# Patient Record
Sex: Female | Born: 1980 | Hispanic: Yes | Marital: Single | Smoking: Never smoker
Health system: Southern US, Community
[De-identification: ages and names within clinical notes are randomized; demographics above are authoritative.]

## PROBLEM LIST (undated history)

## (undated) ENCOUNTER — Inpatient Hospital Stay (HOSPITAL_COMMUNITY): Payer: Self-pay

## (undated) DIAGNOSIS — Z789 Other specified health status: Secondary | ICD-10-CM

## (undated) HISTORY — PX: APPENDECTOMY: SHX54

---

## 2006-11-15 ENCOUNTER — Ambulatory Visit: Payer: Self-pay | Admitting: Family Medicine

## 2006-11-22 ENCOUNTER — Emergency Department (HOSPITAL_COMMUNITY): Admission: EM | Admit: 2006-11-22 | Discharge: 2006-11-22 | Payer: Self-pay | Admitting: Emergency Medicine

## 2007-03-23 ENCOUNTER — Ambulatory Visit (HOSPITAL_COMMUNITY): Admission: RE | Admit: 2007-03-23 | Discharge: 2007-03-23 | Payer: Self-pay | Admitting: Family Medicine

## 2007-04-16 ENCOUNTER — Inpatient Hospital Stay (HOSPITAL_COMMUNITY): Admission: AD | Admit: 2007-04-16 | Discharge: 2007-04-16 | Payer: Self-pay | Admitting: Obstetrics and Gynecology

## 2007-06-21 ENCOUNTER — Inpatient Hospital Stay (HOSPITAL_COMMUNITY): Admission: AD | Admit: 2007-06-21 | Discharge: 2007-06-23 | Payer: Self-pay | Admitting: Obstetrics & Gynecology

## 2007-06-21 ENCOUNTER — Ambulatory Visit: Payer: Self-pay | Admitting: Gynecology

## 2007-08-31 ENCOUNTER — Ambulatory Visit: Payer: Self-pay | Admitting: Family Medicine

## 2007-11-23 ENCOUNTER — Ambulatory Visit: Payer: Self-pay | Admitting: Gynecology

## 2007-12-26 ENCOUNTER — Ambulatory Visit: Admission: RE | Admit: 2007-12-26 | Discharge: 2007-12-26 | Payer: Self-pay | Admitting: Gynecology

## 2011-08-06 LAB — COMPREHENSIVE METABOLIC PANEL
Albumin: 3.2 — ABNORMAL LOW
Alkaline Phosphatase: 278 — ABNORMAL HIGH
BUN: 6
Chloride: 105
GFR calc Af Amer: >60
Glucose, Bld: 106 — ABNORMAL HIGH
Sodium: 134 — ABNORMAL LOW

## 2011-08-06 LAB — URIC ACID: Uric Acid, Serum: 4.9

## 2011-08-06 LAB — CBC
HCT: 25.3 — ABNORMAL LOW
HCT: 39.1
Hemoglobin: 13.6
Hemoglobin: 8.8 — ABNORMAL LOW
MCV: 90.8
MCV: 91.9
RBC: 2.75 — ABNORMAL LOW
RDW: 13

## 2011-08-06 LAB — AMYLASE: Amylase: 105

## 2011-08-06 LAB — LACTATE DEHYDROGENASE: LDH: 186

## 2011-08-11 LAB — URINALYSIS, ROUTINE W REFLEX MICROSCOPIC
Bilirubin Urine: NEGATIVE
Hgb urine dipstick: NEGATIVE
Ketones, ur: NEGATIVE
Nitrite: NEGATIVE
Specific Gravity, Urine: 1.015
Urobilinogen, UA: 0.2
pH: 7.5

## 2016-12-25 ENCOUNTER — Encounter (HOSPITAL_COMMUNITY): Payer: Self-pay

## 2016-12-25 ENCOUNTER — Inpatient Hospital Stay (HOSPITAL_COMMUNITY): Payer: Self-pay

## 2016-12-25 ENCOUNTER — Inpatient Hospital Stay (HOSPITAL_COMMUNITY)
Admission: AD | Admit: 2016-12-25 | Discharge: 2016-12-25 | Disposition: A | Discharge status: Home / Self Care | Payer: Self-pay | Source: Ambulatory Visit | Attending: Obstetrics & Gynecology | Admitting: Obstetrics & Gynecology

## 2016-12-25 DIAGNOSIS — O209 Hemorrhage in early pregnancy, unspecified: Secondary | ICD-10-CM

## 2016-12-25 DIAGNOSIS — Z3A09 9 weeks gestation of pregnancy: Secondary | ICD-10-CM | POA: Insufficient documentation

## 2016-12-25 DIAGNOSIS — O26891 Other specified pregnancy related conditions, first trimester: Secondary | ICD-10-CM | POA: Insufficient documentation

## 2016-12-25 DIAGNOSIS — R109 Unspecified abdominal pain: Secondary | ICD-10-CM | POA: Insufficient documentation

## 2016-12-25 DIAGNOSIS — O208 Other hemorrhage in early pregnancy: Secondary | ICD-10-CM | POA: Insufficient documentation

## 2016-12-25 HISTORY — DX: Other specified health status: Z78.9

## 2016-12-25 LAB — URINALYSIS, ROUTINE W REFLEX MICROSCOPIC
Bilirubin Urine: NEGATIVE
GLUCOSE, UA: NEGATIVE mg/dL
Ketones, ur: NEGATIVE mg/dL
Nitrite: NEGATIVE
PROTEIN: 30 mg/dL — AB
SPECIFIC GRAVITY, URINE: 1.012 (ref 1.005–1.030)
pH: 9 — ABNORMAL HIGH (ref 5.0–8.0)

## 2016-12-25 LAB — POCT PREGNANCY, URINE: PREG TEST UR: POSITIVE — AB

## 2016-12-25 LAB — HCG, QUANTITATIVE, PREGNANCY: hCG, Beta Chain, Quant, S: 8527 m[IU]/mL — ABNORMAL HIGH (ref <5)

## 2016-12-25 MED ORDER — CEPHALEXIN 500 MG PO CAPS: 500.0000 mg | ORAL_CAPSULE | Freq: Four times a day (QID) | ORAL | 0 refills | Status: DC

## 2016-12-25 NOTE — MAU Provider Note (Signed)
History   G2P0010 @ 9.3 wks by LMP in with abd pain and vag bleeding since 0230 this morning. Denies any other complaint  CSN: [id number]  Arrival date & time 12/25/16  1234   None     Chief Complaint  Patient presents with  . Abdominal Pain    HPI  Past Medical History:  Diagnosis Date  . Medical history non-contributory     Past Surgical History:  Procedure Laterality Date  . APPENDECTOMY      No family history on file.  Social History  Substance Use Topics  . Smoking status: Never Smoker  . Smokeless tobacco: Never Used  . Alcohol use No    OB History    Gravida Para Term Preterm AB Living   2         1   SAB TAB Ectopic Multiple Live Births                  Review of Systems  Constitutional: Negative.   HENT: Negative.   Eyes: Negative.   Respiratory: Negative.   Cardiovascular: Negative.   Gastrointestinal: Positive for abdominal pain.  Endocrine: Negative.   Genitourinary: Positive for vaginal bleeding.  Musculoskeletal: Negative.   Skin: Negative.     Allergies  Patient has no allergy information on record.  Home Medications    BP (!) 109/48 (BP Location: Left Arm)   Pulse 83   Resp 18   Physical Exam  Constitutional: She is oriented to person, place, and time. She appears well-developed and well-nourished.  HENT:  Head: Normocephalic.  Eyes: Pupils are equal, round, and reactive to light.  Neck: Normal range of motion.  Cardiovascular: Normal rate, regular rhythm, normal heart sounds and intact distal pulses.   Pulmonary/Chest: Effort normal and breath sounds normal.  Abdominal: Soft. Bowel sounds are normal.  Genitourinary:  Genitourinary Comments: sm amt dark vag bleeding  Musculoskeletal: Normal range of motion.  Neurological: She is alert and oriented to person, place, and time. She has normal reflexes.  Skin: Skin is warm and dry.  Psychiatric: She has a normal mood and affect. Her behavior is normal. Judgment and thought  content normal.    MAU Course  Procedures (including critical care time)  Labs Reviewed  URINALYSIS, ROUTINE W REFLEX MICROSCOPIC - Abnormal; Notable for the following:       Result Value   APPearance HAZY (*)    pH 9.0 (*)    Hgb urine dipstick LARGE (*)    Protein, ur 30 (*)    Leukocytes, UA TRACE (*)    Bacteria, UA RARE (*)    Squamous Epithelial / LPF 0-5 (*)    All other components within normal limits  POCT PREGNANCY, URINE - Abnormal; Notable for the following:    Preg Test, Ur POSITIVE (*)    All other components within normal limits  HCG, QUANTITATIVE, PREGNANCY   No results found.   1. Abdominal pain during pregnancy in first trimester       MDM  VSS, sm amt dark vag bleeding. [place] shows early IUP. Quant [id number]. Will sent to clinic Monday for repeat quant.

## 2016-12-25 NOTE — MAU Note (Signed)
Pt presents to MAU with abdominal pain and bleeding. Pt states symptoms started around 2:30am. Pt reports feeling cramping in her lower abdomen, pain in LUQ and lower back pain. Pt States she has had a +HPT

## 2016-12-25 NOTE — Discharge Instructions (Signed)
Dolor abdominal durante el embarazo  (Abdominal Pain During Pregnancy)  El dolor de vientre (abdominal) es habitual durante el embarazo. Generalmente no se trata de un problema grave. Otras veces puede ser un signo de que algo no anda bien. Siempre comuníquese con su médico si tiene dolor abdominal.  CUIDADOS EN EL HOGAR  Controle el dolor para ver si hay cambios. Las indicaciones que siguen pueden ayudarla a sentirse mejor:  · Notenga sexo (relaciones sexuales) ni se coloque nada dentro de la vagina hasta que se sienta mejor.  · Haga reposo hasta que el dolor se calme.  · Si siente ganas de vomitar (náuseas ) beba líquidos claros. No consuma alimentos sólidos hasta que se sienta mejor.  · Sólo tome los medicamentos que le haya indicado su médico.  · Cumpla con las visitas al médico según las indicaciones.  SOLICITE AYUDA DE INMEDIATO SI:  · Tiene un sangrado, pierde líquido o elimina trozos de tejido por la vagina.  · Siente más dolor o cólicos.  · Comienza a vomitar.  · Siente dolor al orinar u observa sangre en la orina.  · Tiene fiebre.  · No siente que el bebé se mueva mucho.  · Se siente muy débil o cree que va a desmayarse.  · Tiene dificultad para respirar con o sin dolor en el vientre.  · Siente un dolor de cabeza muy intenso y dolor en el vientre.  · Observa que sale un líquido por la vagina y tiene dolor abdominal.  · La materia fecal es líquida (diarrea).  · El dolor en el viente no desaparece, o empeora, luego de hacer reposo.    ASEGÚRESE DE QUE:  · Comprende estas instrucciones.  · Controlará su afección.  · Recibirá ayuda de inmediato si no mejora o si empeora.    Esta información no tiene como fin reemplazar el consejo del médico. Asegúrese de hacerle al médico cualquier pregunta que tenga.  Document Released: 06/23/2011 Document Revised: 02/02/2016 Document Reviewed: 05/10/2013  Elsevier Interactive Patient Education © 2017 Elsevier Inc.

## 2016-12-27 ENCOUNTER — Encounter (HOSPITAL_COMMUNITY): Payer: Self-pay | Admitting: *Deleted

## 2016-12-27 ENCOUNTER — Inpatient Hospital Stay (HOSPITAL_COMMUNITY)
Admission: AD | Admit: 2016-12-27 | Discharge: 2016-12-27 | Disposition: A | Discharge status: Home / Self Care | Payer: Self-pay | Source: Ambulatory Visit | Attending: Family Medicine | Admitting: Family Medicine

## 2016-12-27 ENCOUNTER — Ambulatory Visit: Payer: Self-pay

## 2016-12-27 ENCOUNTER — Inpatient Hospital Stay (HOSPITAL_COMMUNITY): Payer: Self-pay

## 2016-12-27 DIAGNOSIS — N939 Abnormal uterine and vaginal bleeding, unspecified: Secondary | ICD-10-CM

## 2016-12-27 DIAGNOSIS — R109 Unspecified abdominal pain: Secondary | ICD-10-CM | POA: Insufficient documentation

## 2016-12-27 DIAGNOSIS — O039 Complete or unspecified spontaneous abortion without complication: Secondary | ICD-10-CM | POA: Insufficient documentation

## 2016-12-27 DIAGNOSIS — Z3A01 Less than 8 weeks gestation of pregnancy: Secondary | ICD-10-CM | POA: Insufficient documentation

## 2016-12-27 LAB — ABO/RH: ABO/RH(D): B POS

## 2016-12-27 MED ORDER — OXYCODONE-ACETAMINOPHEN 5-325 MG PO TABS
1.0000 | ORAL_TABLET | Freq: Once | ORAL | Status: AC
  Administered 2016-12-27: 1 via ORAL
  Filled 2016-12-27: qty 1

## 2016-12-27 MED ORDER — KETOROLAC TROMETHAMINE 60 MG/2ML IM SOLN
60.0000 mg | Freq: Once | INTRAMUSCULAR | Status: AC
  Administered 2016-12-27: 60 mg via INTRAMUSCULAR
  Filled 2016-12-27: qty 2

## 2016-12-27 MED ORDER — HYDROCODONE-ACETAMINOPHEN 5-325 MG PO TABS: 1.0000 | ORAL_TABLET | ORAL | 0 refills | Status: DC | PRN

## 2016-12-27 MED ORDER — IBUPROFEN 600 MG PO TABS: 600.0000 mg | ORAL_TABLET | Freq: Four times a day (QID) | ORAL | 0 refills | Status: DC | PRN

## 2016-12-27 NOTE — Discharge Instructions (Signed)
Aborto espontáneo °(Miscarriage) °El aborto espontáneo es la pérdida de un bebé que no ha nacido.(feto) antes de la semana 20 del embarazo. La causa generalmente es desconocida. °CUIDADOS EN EL HOGAR °· Debe permanecer en cama (reposo en cama) o podrá hacer actividades livianas. Regrese a sus actividades según las indicaciones del médico. °· Pida ayuda con las tareas domésticas. °· Anote cuántos apósitos usa por día. Describa el grado en que están empapados. °· No use tampones. No se higienice la vagina (duchas vaginales) ni tenga relaciones sexuales (coito) hasta que el médico la autorice. °· Sólo debe tomar la medicación según las indicaciones del médico. °· No tome aspirina. °· Cumpla con los controles médicos según las indicaciones. °· Si usted o su pareja tienen problemas con el duelo, hable con su médico. También puede intentar con psicoterapia. Permítase el tiempo suficiente de duelo antes de quedar embarazada nuevamente. ° °SOLICITE AYUDA DE INMEDIATO SI: °· Siente cólicos intensos o dolor en el estómago, en la espalda o en el vientre (abdomen). °· Tiene fiebre. °· Elimina grumos de sangre (coágulos) por la vagina, que tienen el tamaño de una nuez o más. Guarde los coágulos para que el médico los vea. °· Elimina gran cantidad de tejidos por la vagina. Guarde lo que ha eliminado para que su médico lo examine. °· Aumenta el sangrado. °· Observa una secreción espesa, con mal olor (pérdida) que proviene de la vagina. °· Se siente mareada, débil o se desvanece (se desmaya). °· Siente escalofríos. ° °ASEGÚRESE DE QUE: °· Comprende estas instrucciones. °· Controlará su enfermedad. °· Solicitará ayuda de inmediato si no mejora o si empeora. ° °Esta información no tiene como fin reemplazar el consejo del médico. Asegúrese de hacerle al médico cualquier pregunta que tenga. °Document Released: 04/11/2012 Document Revised: 04/11/2012 Document Reviewed: 11/11/2011 °Elsevier Interactive Patient Education © 2017 Elsevier  Inc. ° °

## 2016-12-27 NOTE — MAU Provider Note (Signed)
History     CSN: [id number]  Arrival date and time: 12/27/16 4098   First Provider Initiated Contact with Patient 12/27/16 [phone]      Chief Complaint  Patient presents with  . Abdominal Pain  . Vaginal Bleeding   HPI   Hayley Wolfe spanish interpretor at bedside.   [name] is a 36 y.o. female G3P0 @ [redacted]w[redacted]d here in MAU with worsening pain and bleeding. She was seen here on Saturday 3/3 for these symptoms. She was told that the pregnancy was early and that she needed to come here to the [facility] for blood work. Instead of going to the clinic she came here because her symptoms have worsened. She currently rates her pain 9/10, Her bleeding is heavy like a menstrual cycle.   OB History    Gravida Para Term Preterm AB Living   3         1   SAB TAB Ectopic Multiple Live Births                  Past Medical History:  Diagnosis Date  . Medical history non-contributory     Past Surgical History:  Procedure Laterality Date  . APPENDECTOMY      History reviewed. No pertinent family history.  Social History  Substance Use Topics  . Smoking status: Never Smoker  . Smokeless tobacco: Never Used  . Alcohol use No    Allergies: No Known Allergies  Prescriptions Prior to Admission  Medication Sig Dispense Refill Last Dose  . cephALEXin (KEFLEX) 500 MG capsule Take 1 capsule (500 mg total) by mouth 4 (four) times daily. 20 capsule 0   . Prenatal Vit-Fe Fumarate-FA (PRENATAL MULTIVITAMIN) TABS tablet Take 1 tablet by mouth daily at 12 noon.   12/24/2016 at Unknown time    Results for orders placed or performed during the hospital encounter of 12/27/16 (from the past 48 hour(s))  ABO/Rh     Status: None   Collection Time: 12/27/16  8:31 AM  Result Value Ref Range   ABO/RH(D) B POS    US Ob Transvaginal  Result Date: 12/27/2016 CLINICAL DATA:  36 year old pregnant female presenting with worsening pelvic pain and bleeding. Early intrauterine gestation without embryonic cardiac  activity on obstetric scan from 2 days prior. EDC by initial sonogram: 08/22/2017, projecting to an expected gestational age of [redacted] weeks 0 days. EXAM: TRANSVAGINAL OB ULTRASOUND TECHNIQUE: Transvaginal ultrasound was performed for complete evaluation of the gestation as well as the maternal uterus, adnexal regions, and pelvic cul-de-sac. COMPARISON:  12/25/2016 obstetric scan. FINDINGS: Anteverted anteflexed uterus. No uterine fibroids or other myometrial abnormalities. Bilayer endometrial thickness 9 mm. Heterogeneous endometrium. Previously visualized intrauterine gestational sac is absent on this scan. No discrete endometrial mass. Right ovary measures 2.5 x 1.6 x 2.0 cm. Left ovary measures 2.7 x 2.2 x 2.6 cm. No suspicious ovarian or adnexal masses. No abnormal free fluid in the pelvis. IMPRESSION: Heterogeneous endometrium measuring 9 mm in bilayer thickness. Previously visualized intrauterine gestational sac is absent on this scan. Findings are compatible with spontaneous abortion in progress. No ovarian or adnexal abnormality. No abnormal free fluid in the pelvis. Electronically Signed   By: Delbert Phenix M.D.   On: 12/27/2016 09:37   Review of Systems  Constitutional: Negative for chills and fever.  Gastrointestinal: Positive for abdominal pain.  Genitourinary: Positive for vaginal bleeding.   Physical Exam   Blood pressure 111/65, pulse 88, temperature 98.4 F (36.9 C), temperature source Oral, resp. rate  18, height 5' 1.5" (1.562 m), weight 141 lb (64 kg), SpO2 100 %.  Physical Exam  Constitutional: She is oriented to person, place, and time. She appears well-developed and well-nourished. No distress.  HENT:  Head: Normocephalic.  GI: Soft. There is tenderness in the right lower quadrant, suprapubic area and left lower quadrant. There is rigidity. There is no rebound and no guarding.  Genitourinary:  Genitourinary Comments: Vagina - Small amount of dark red blood Cervix -  + active  bleeding  Bimanual exam: Cervix slightly open  Uterus non tender, normal size Chaperone present for exam.   Musculoskeletal: Normal range of motion.  Neurological: She is alert and oriented to person, place, and time.  Skin: Skin is warm. She is not diaphoretic.  Psychiatric: Her behavior is normal.    MAU Course  Procedures  None  MDM  B positive blood type  US on 3/3 shows gestational sac with Yolk sac and embryo: 3730w5d. Quant 1,6108,527. Due to the amount of bleeding and patients significant pain will repeat US today Toradol 60 mg IM given, percocet 1 tab given PO US today shows absent gestational sac. Pain improved.   Assessment and Plan   A:  1. SAB (spontaneous abortion)   2. Vaginal bleeding     P:  Discharge home in stable condition Rx: Ibuprofen, percocet [facility]-2 weeks Bleeding precautions Return to MAU if symptoms worsen Support given  Duane LopeJennifer I Rasch, NP 12/27/2016 2:33 PM

## 2016-12-27 NOTE — MAU Note (Signed)
Was here on Saturday. Has an appt at clinic this morning for repeat bloodwork.  Having increase in pain and bleeding this morning.

## 2017-01-10 ENCOUNTER — Ambulatory Visit (INDEPENDENT_AMBULATORY_CARE_PROVIDER_SITE_OTHER): Payer: Self-pay | Admitting: Family Medicine

## 2017-01-10 ENCOUNTER — Ambulatory Visit (INDEPENDENT_AMBULATORY_CARE_PROVIDER_SITE_OTHER): Payer: Self-pay | Admitting: Clinical

## 2017-01-10 ENCOUNTER — Encounter: Payer: Self-pay | Admitting: Family Medicine

## 2017-01-10 VITALS — BP 116/76 | HR 72 | Ht 63.0 in | Wt 141.2 lb

## 2017-01-10 DIAGNOSIS — F4323 Adjustment disorder with mixed anxiety and depressed mood: Secondary | ICD-10-CM

## 2017-01-10 DIAGNOSIS — Z113 Encounter for screening for infections with a predominantly sexual mode of transmission: Secondary | ICD-10-CM

## 2017-01-10 DIAGNOSIS — R4589 Other symptoms and signs involving emotional state: Secondary | ICD-10-CM

## 2017-01-10 DIAGNOSIS — O039 Complete or unspecified spontaneous abortion without complication: Secondary | ICD-10-CM

## 2017-01-10 DIAGNOSIS — N898 Other specified noninflammatory disorders of vagina: Secondary | ICD-10-CM

## 2017-01-10 NOTE — Progress Notes (Signed)
Pt thinks she may have a vaginal infection due to a strong odor.  Pt to see Asher MuirJamie for high score of phq9.

## 2017-01-10 NOTE — Patient Instructions (Addendum)
Aborto espontáneo °(Miscarriage) °El aborto espontáneo es la pérdida de un bebé que no ha nacido.(feto) antes de la semana 20 del embarazo. La causa generalmente es desconocida. °CUIDADOS EN EL HOGAR °· Debe permanecer en cama (reposo en cama) o podrá hacer actividades livianas. Regrese a sus actividades según las indicaciones del médico. °· Pida ayuda con las tareas domésticas. °· Anote cuántos apósitos usa por día. Describa el grado en que están empapados. °· No use tampones. No se higienice la vagina (duchas vaginales) ni tenga relaciones sexuales (coito) hasta que el médico la autorice. °· Sólo debe tomar la medicación según las indicaciones del médico. °· No tome aspirina. °· Cumpla con los controles médicos según las indicaciones. °· Si usted o su pareja tienen problemas con el duelo, hable con su médico. También puede intentar con psicoterapia. Permítase el tiempo suficiente de duelo antes de quedar embarazada nuevamente. ° °SOLICITE AYUDA DE INMEDIATO SI: °· Siente cólicos intensos o dolor en el estómago, en la espalda o en el vientre (abdomen). °· Tiene fiebre. °· Elimina grumos de sangre (coágulos) por la vagina, que tienen el tamaño de una nuez o más. Guarde los coágulos para que el médico los vea. °· Elimina gran cantidad de tejidos por la vagina. Guarde lo que ha eliminado para que su médico lo examine. °· Aumenta el sangrado. °· Observa una secreción espesa, con mal olor (pérdida) que proviene de la vagina. °· Se siente mareada, débil o se desvanece (se desmaya). °· Siente escalofríos. ° °ASEGÚRESE DE QUE: °· Comprende estas instrucciones. °· Controlará su enfermedad. °· Solicitará ayuda de inmediato si no mejora o si empeora. ° °Esta información no tiene como fin reemplazar el consejo del médico. Asegúrese de hacerle al médico cualquier pregunta que tenga. °Document Released: 04/11/2012 Document Revised: 04/11/2012 Document Reviewed: 11/11/2011 °Elsevier Interactive Patient Education © 2017 Elsevier  Inc. ° °

## 2017-01-10 NOTE — Progress Notes (Signed)
    Subjective:    Patient ID: Hayley Wolfe is a 36 y.o. female presenting with Miscarriage  on 01/10/2017 Spanish interpreter: Mariel used  HPI: s/p SAB 3/5. No further bleeding. Reports several days of vaginal odor.  Report some LUQ pain. Began when she got pregnant. stabbing pain. Comes and goes, and not related to food, BM, movement. Improved with sleeping on the affected side.  Reports poor mood since her miscarriage. She is very irritated and grumpy most of the time.   Review of Systems  Constitutional: Negative for chills and fever.  Respiratory: Negative for shortness of breath.   Cardiovascular: Negative for chest pain.  Gastrointestinal: Negative for abdominal pain, nausea and vomiting.  Genitourinary: Negative for dysuria.  Skin: Negative for rash.      Objective:    BP 116/76   Pulse 72   Ht 5\' 3"  (1.6 m)   Wt 141 lb 3.2 oz (64 kg)   Breastfeeding? No   BMI 25.01 kg/m  Physical Exam  Constitutional: She is oriented to person, place, and time. She appears well-developed and well-nourished. No distress.  HENT:  Head: Normocephalic and atraumatic.  Eyes: No scleral icterus.  Neck: Neck supple.  Cardiovascular: Normal rate.   Pulmonary/Chest: Effort normal.  Abdominal: Soft.  Genitourinary:  Genitourinary Comments: BUS normal, vagina is pink and rugated, cervix is nulliparous without lesion, uterus is small and anteverted, no adnexal mass or tenderness.   Neurological: She is alert and oriented to person, place, and time.  Skin: Skin is warm and dry.  Psychiatric: She has a normal mood and affect.        Assessment & Plan:  Complete miscarriage - complete-->ok to try again asap  Vaginal odor - check wet prep - Plan: Cervicovaginal ancillary only  Dysphoric mood - to see Asher MuirJamie - Plan: Ambulatory referral to Integrated Behavioral Health   Total face-to-face time with patient: 15 minutes. Over 50% of encounter was spent on counseling and  coordination of care. Return if symptoms worsen or fail to improve.  Reva Boresanya S Luisalberto Beegle 01/10/2017 10:45 AM

## 2017-01-10 NOTE — BH Specialist Note (Addendum)
Integrated Behavioral Health Initial Visit  MRN: [id number] Name: Providence CrosbyManuela Jarquin-Rios   Session Start time: 12:15 Session End time: 12:35 Total time: 20 minutes  Type of Service: Integrated Behavioral Health- Individual/Family Interpretor:Yes.   Interpretor Name and Language: Addison NaegeliMuriel, Spanish   Warm Hand Off Completed.       SUBJECTIVE: Providence CrosbyManuela Jarquin-Rios is a 36 y.o. female accompanied by patient. Patient was referred by Dr Shawnie PonsPratt for depression. Patient reports the following symptoms/concerns: Pt states that her primary concern is mild sleep difficulty since her miscarriage, and husband being sick; copes by spending time with husband and 9yo son. Duration of problem: 2 weeks; Severity of problem: moderate  OBJECTIVE: Mood: Appropriate and Affect: Appropriate Risk of harm to self or others: No plan to harm self or others   LIFE CONTEXT: Family and Social: Lives with husband and 9yo School/Work: - Self-Care: - Life Changes: Husband recently sick, prior to miscarriage  GOALS ADDRESSED: Patient will reduce symptoms of: depression and anxiety increase knowledge and/or ability of: coping skills and also: Increase healthy adjustment to current life circumstances   INTERVENTIONS: Motivational Interviewing  Standardized Assessments completed: GAD-7 and PHQ 9  ASSESSMENT: Patient currently experiencing Adjustment disorder with mixed anxiety and depression. Patient may benefit from psychoeducation and brief therapeutic intervention regarding coping with symptoms of anxiety and depression.  PLAN: 1. Follow up with behavioral health clinician on : As needed 2. Behavioral recommendations:  -Read educational material regarding coping with symptoms of depression and anxiety -Consider sleep app, and leaving "worries" at bedside notebook, for improved quality of sleep -Consider [facility], as needed, if symptoms increase or do not  improve 3. Referral(s): [facility] and Counselor 4. "From scale of 1-10, how likely are you to follow plan?": 9  Valetta CloseJamie C McMannes, LCSWA  Depression screen [facility] 01/10/2017  Decreased Interest 2  Down, Depressed, Hopeless 2  PHQ - 2 Score 4  Altered sleeping 2  Tired, decreased energy 2  Change in appetite 2  Feeling bad or failure about yourself  2  Trouble concentrating 2  Moving slowly or fidgety/restless 2  Suicidal thoughts 0  PHQ-9 Score 16   GAD 7 : Generalized Anxiety Score 01/10/2017  Nervous, Anxious, on Edge 2  Control/stop worrying 2  Worry too much - different things 2  Trouble relaxing 2  Restless 1  Easily annoyed or irritable 2  Afraid - awful might happen 2  Total GAD 7 Score 13  Anxiety Difficulty Not difficult at all

## 2017-01-11 LAB — CERVICOVAGINAL ANCILLARY ONLY
Bacterial vaginitis: NEGATIVE
CANDIDA VAGINITIS: NEGATIVE
TRICH (WINDOWPATH): NEGATIVE

## 2017-10-25 NOTE — L&D Delivery Note (Addendum)
Operative Delivery Note At  a viable female was delivered via .  Presentation: vertex; Position: Left,, Occiput,, Anterior; Station: +3.  Verbal consent: obtained from patient.  Risks and benefits discussed in detail.  Risks include, but are not limited to the risks of anesthesia, bleeding, infection, damage to maternal tissues, fetal cephalhematoma.  There is also the risk of inability to effect vaginal delivery of the head, or shoulder dystocia that cannot be resolved by established maneuvers, leading to the need for emergency cesarean section. Fetal heal at crowning, kiwi vac placed and pt assisted x 1 pull with delivery of fetal head. Nuchal cord noted x 1 reduced. Unable to deliver anterior should after McRoberts, super pubic pressure, and woods screw maneuver. Posterior should delivered with delivery of rest of infant. Length of dystocia 1 minute. APGAR:  pending per NICU; weight  .   Placenta status:spont , shultz.   Cord:3vc  with the following complications:shoulder dysticia .  Cord pH: 7.30  Anesthesia epidural  :   Instruments: Kiwi Episiotomy:  none Lacerations:  4th Suture Repair: repair done by Dr. Emelda FearFerguson Est. Blood Loss 600(mL):    Mom to postpartum.  Baby to Couplet care / Skin to Skin.  Hayley Wolfe 06/04/2018, 6:27 PM  Repair of 4th degree laceration: Anal mucosa: 3-0 Vicryl Anal Sphincter . 4 sutures, figure of 8, superior, inferior, posterior, and anterior. 2-layer closure of perineal body , continuous running. 2-0 vicryl. Digital rectal confirms no defects or palpable sutures. Ancef 2 gm IV as surgical prophylaxis. Hayley BurrowJohn V Jcion Buddenhagen, MD

## 2017-12-26 ENCOUNTER — Inpatient Hospital Stay (HOSPITAL_COMMUNITY): Payer: Self-pay

## 2017-12-26 ENCOUNTER — Other Ambulatory Visit: Payer: Self-pay

## 2017-12-26 ENCOUNTER — Inpatient Hospital Stay (HOSPITAL_COMMUNITY)
Admission: AD | Admit: 2017-12-26 | Discharge: 2017-12-26 | Disposition: A | Discharge status: Home / Self Care | Payer: Self-pay | Source: Ambulatory Visit | Attending: Obstetrics and Gynecology | Admitting: Obstetrics and Gynecology

## 2017-12-26 ENCOUNTER — Encounter (HOSPITAL_COMMUNITY): Payer: Self-pay | Admitting: *Deleted

## 2017-12-26 DIAGNOSIS — O0932 Supervision of pregnancy with insufficient antenatal care, second trimester: Secondary | ICD-10-CM | POA: Insufficient documentation

## 2017-12-26 DIAGNOSIS — Z3A16 16 weeks gestation of pregnancy: Secondary | ICD-10-CM | POA: Insufficient documentation

## 2017-12-26 DIAGNOSIS — R102 Pelvic and perineal pain: Secondary | ICD-10-CM | POA: Insufficient documentation

## 2017-12-26 DIAGNOSIS — Z9889 Other specified postprocedural states: Secondary | ICD-10-CM | POA: Insufficient documentation

## 2017-12-26 DIAGNOSIS — Z3687 Encounter for antenatal screening for uncertain dates: Secondary | ICD-10-CM | POA: Insufficient documentation

## 2017-12-26 DIAGNOSIS — Z79899 Other long term (current) drug therapy: Secondary | ICD-10-CM | POA: Insufficient documentation

## 2017-12-26 DIAGNOSIS — R109 Unspecified abdominal pain: Secondary | ICD-10-CM | POA: Insufficient documentation

## 2017-12-26 DIAGNOSIS — O26899 Other specified pregnancy related conditions, unspecified trimester: Secondary | ICD-10-CM

## 2017-12-26 DIAGNOSIS — O26892 Other specified pregnancy related conditions, second trimester: Secondary | ICD-10-CM | POA: Insufficient documentation

## 2017-12-26 DIAGNOSIS — N83292 Other ovarian cyst, left side: Secondary | ICD-10-CM | POA: Insufficient documentation

## 2017-12-26 DIAGNOSIS — O9989 Other specified diseases and conditions complicating pregnancy, childbirth and the puerperium: Secondary | ICD-10-CM | POA: Insufficient documentation

## 2017-12-26 DIAGNOSIS — O09522 Supervision of elderly multigravida, second trimester: Secondary | ICD-10-CM | POA: Insufficient documentation

## 2017-12-26 LAB — URINALYSIS, ROUTINE W REFLEX MICROSCOPIC
BACTERIA UA: NONE SEEN
BILIRUBIN URINE: NEGATIVE
Glucose, UA: NEGATIVE mg/dL
Ketones, ur: 5 mg/dL — AB
LEUKOCYTES UA: NEGATIVE
NITRITE: NEGATIVE
PROTEIN: NEGATIVE mg/dL
Specific Gravity, Urine: 1.02 (ref 1.005–1.030)
pH: 7 (ref 5.0–8.0)

## 2017-12-26 LAB — HCG, QUANTITATIVE, PREGNANCY: HCG, BETA CHAIN, QUANT, S: 26269 m[IU]/mL — AB (ref <5)

## 2017-12-26 LAB — ABO/RH: ABO/RH(D): B POS

## 2017-12-26 LAB — CBC
HEMATOCRIT: 35.9 % — AB (ref 36.0–46.0)
HEMOGLOBIN: 12.6 g/dL (ref 12.0–15.0)
MCH: 30.4 pg (ref 26.0–34.0)
MCHC: 35.1 g/dL (ref 30.0–36.0)
MCV: 86.5 fL (ref 78.0–100.0)
Platelets: 218 10*3/uL (ref 150–400)
RBC: 4.15 MIL/uL (ref 3.87–5.11)
RDW: 13.2 % (ref 11.5–15.5)
WBC: 12.3 10*3/uL — AB (ref 4.0–10.5)

## 2017-12-26 MED ORDER — LACTATED RINGERS IV SOLN
INTRAVENOUS | Status: DC
  Administered 2017-12-26: 17:00:00 via INTRAVENOUS

## 2017-12-26 MED ORDER — OXYCODONE-ACETAMINOPHEN 5-325 MG PO TABS: 1.0000 | ORAL_TABLET | Freq: Four times a day (QID) | ORAL | 0 refills | Status: DC | PRN

## 2017-12-26 MED ORDER — KETOROLAC TROMETHAMINE 30 MG/ML IJ SOLN
30.0000 mg | Freq: Once | INTRAMUSCULAR | Status: AC
  Administered 2017-12-26: 30 mg via INTRAVENOUS
  Filled 2017-12-26: qty 1

## 2017-12-26 MED ORDER — HYDROMORPHONE HCL 1 MG/ML IJ SOLN
1.0000 mg | Freq: Once | INTRAMUSCULAR | Status: AC
  Administered 2017-12-26: 1 mg via INTRAVENOUS
  Filled 2017-12-26: qty 1

## 2017-12-26 NOTE — MAU Provider Note (Signed)
History     CSN: [id number]  Arrival date and time: 12/26/17 1635   First Provider Initiated Contact with Patient 12/26/17 1641      Chief Complaint  Patient presents with  . Abdominal Pain   HPI Hayley Wolfe is 37 y.o. G3O7564 [redacted]w[redacted]d weeks presenting with acute onset of pain approx 30 mins prior to arrival.  She appears very uncomfortable. Pain is in the lower left quadrant. Began as intermittent pain and after 10 minutes was constant. With interpreter, history of obtained. LMP 09/09/2017-normal cycle for her.  By dates she is [redacted]w[redacted]d.  Describes having a little nausea without vomiting.  Neg for vaginal bleeding.  She has not begun prenatal care.       Past Medical History:  Diagnosis Date  . Medical history non-contributory     Past Surgical History:  Procedure Laterality Date  . APPENDECTOMY      No family history on file.  Social History   Tobacco Use  . Smoking status: Never Smoker  . Smokeless tobacco: Never Used  Substance Use Topics  . Alcohol use: No  . Drug use: No    Allergies: No Known Allergies  Medications Prior to Admission  Medication Sig Dispense Refill Last Dose  . Prenatal Vit-Fe Fumarate-FA (PRENATAL MULTIVITAMIN) TABS tablet Take 1 tablet by mouth daily at 12 noon.   Not Taking    Review of Systems  Constitutional: Negative for appetite change, chills, fatigue and fever.  Respiratory: Negative for shortness of breath.   Cardiovascular: Negative for chest pain.  Gastrointestinal: Positive for abdominal pain, constipation, diarrhea and vomiting.  Genitourinary: Positive for pelvic pain. Negative for vaginal bleeding and vaginal discharge.  Neurological: Negative for headaches.  Psychiatric/Behavioral: Negative for behavioral problems.   Physical Exam   Blood pressure (!) 108/59, pulse 73, temperature 97.7 F (36.5 C), temperature source Oral, resp. rate 18, last menstrual period 09/09/2017, SpO2 100 %.  Physical Exam  Nursing note  and vitals reviewed. Constitutional: She is oriented to person, place, and time. She appears well-developed and well-nourished. She appears distressed.  HENT:  Head: Normocephalic.  Neck: Normal range of motion.  Cardiovascular: Normal rate.  Respiratory: Effort normal.  GI: Soft. She exhibits no distension and no mass. There is tenderness (left lower abdominal pain. Pain in area of round ligament) in the right lower quadrant. There is no rebound, no guarding and no CVA tenderness.  Genitourinary: There is no rash, tenderness or lesion on the right labia. There is no rash, tenderness or lesion on the left labia. Uterus is tender. Enlarged: 15-16 week size.   Cervix exhibits no motion tenderness, no discharge and no friability. No erythema, tenderness or bleeding in the vagina. No vaginal discharge found.  Neurological: She is alert and oriented to person, place, and time.  Skin: Skin is warm and dry.  Psychiatric: She has a normal mood and affect. Her behavior is normal. Thought content normal.   FHR by doppler 150 bpm  Results for orders placed or performed during the hospital encounter of 12/26/17 (from the past 24 hour(s))  CBC     Status: Abnormal   Collection Time: 12/26/17  4:43 PM  Result Value Ref Range   WBC 12.3 (H) 4.0 - 10.5 K/uL   RBC 4.15 3.87 - 5.11 MIL/uL   Hemoglobin 12.6 12.0 - 15.0 g/dL   HCT 33.2 (L) 95.1 - 88.4 %   MCV 86.5 78.0 - 100.0 fL   MCH 30.4 26.0 - 34.0 pg  MCHC 35.1 30.0 - 36.0 g/dL   RDW 40.913.2 81.111.5 - 91.415.5 %   Platelets 218 150 - 400 K/uL  ABO/Rh     Status: None   Collection Time: 12/26/17  4:43 PM  Result Value Ref Range   ABO/RH(D)      B POS Performed at [facility], [address] [place], [place] [id number]   hCG, quantitative, pregnancy     Status: Abnormal   Collection Time: 12/26/17  4:43 PM  Result Value Ref Range   hCG, Beta Chain, Quant, S 26,269 (H) <5 mIU/mL  Urinalysis, Routine w reflex microscopic     Status: Abnormal    Collection Time: 12/26/17  5:33 PM  Result Value Ref Range   Color, Urine YELLOW YELLOW   APPearance HAZY (A) CLEAR   Specific Gravity, Urine 1.020 1.005 - 1.030   pH 7.0 5.0 - 8.0   Glucose, UA NEGATIVE NEGATIVE mg/dL   Hgb urine dipstick LARGE (A) NEGATIVE   Bilirubin Urine NEGATIVE NEGATIVE   Ketones, ur 5 (A) NEGATIVE mg/dL   Protein, ur NEGATIVE NEGATIVE mg/dL   Nitrite NEGATIVE NEGATIVE   Leukocytes, UA NEGATIVE NEGATIVE   RBC / HPF TOO NUMEROUS TO COUNT 0 - 5 RBC/hpf   WBC, UA 0-5 0 - 5 WBC/hpf   Bacteria, UA NONE SEEN NONE SEEN   Squamous Epithelial / LPF 0-5 (A) NONE SEEN   Mucus PRESENT    MAU Course  Procedures  GC/CHL pending.   MDM MSE Labs U/S --ruled out right ovarian torsion.  + for right ovarian cyst Morphine Sulfate 1mg  IV given in MAU Exam Consulted with Dr. Macon LargeAnyanwu.  History, Lab and U/S findings given.  Order given to for Toradol 30 mg IM before discharge home.  Home with Percocet #5 Toradol 30 mg given in MAU  Assessment and Plan  A:  Acute onset of right abdomino/pelvic pain     7764w4d gestation by U/S dating      Left simple ovarian cyst  4.4 X 4.2 X 4 cm      Ovarian torsion ruled out with u/S evaluation      Round ligament pain.  P:  Labs and U/S findings reviewed with the patient as well as Dr. Mont DuttonAnyanwu's plan of care-- Toradol 30mg  IM in MAU and Rx for Percocet for home.      She is waiting for [facility] [employer] [facility] appointment. Encouraged her to follow up with the person who is getting that for her.      Encouraged her to stay well hydrated and avoid twisting movements.     Return for worsening sxs.     Dennison Mascotve M Key 12/26/2017, 6:59 PM

## 2017-12-26 NOTE — MAU Note (Signed)
Pain started about 30 min ago. , LLQ.  Pt is preg.  States LMP was Nov 16

## 2017-12-26 NOTE — Discharge Instructions (Signed)
Dolor abdominal en el embarazo (Abdominal Pain During Pregnancy) El dolor abdominal es frecuente durante el embarazo. Generalmente no causa ningn dao. El dolor abdominal puede tener numerosas causas. Algunas causas son ms graves que otras. Ciertas causas de dolor abdominal durante el embarazo se diagnostican fcilmente. A veces, se tarda un tiempo para llegar al diagnstico. Otras veces la causa no se conoce. El dolor abdominal puede estar relacionado con Jerseyalguna alteracin del [place]embarazo, o puede deberse a una causa totalmente diferente. Por este motivo, siempre consulte a su mdico cuando sienta molestias abdominales. INSTRUCCIONES PARA EL CUIDADO EN EL HOGAR  Est atenta al dolor para ver si hay cambios. Las siguientes indicaciones ayudarn a Psychologist, educationalaliviar cualquier Longs Drug Storesmolestia que pueda sentir:  No Chiropodisttenga relaciones sexuales y no coloque nada dentro de la vagina hasta que los sntomas hayan desaparecido completamente.  Descanse todo lo que pueda RadioShackhasta que el dolor se le haya calmado.  Si siente nuseas, beba lquidos claros. Evite los alimentos slidos mientras sienta malestar o tenga nuseas.  Tome slo medicamentos de venta libre o recetados, segn las indicaciones del mdico.  Cumpla con todas las visitas de control, segn le indique su mdico. SOLICITE ATENCIN MDICA DE INMEDIATO SI:  Tiene un sangrado, prdida de lquidos o elimina tejidos por la vagina.  El dolor o los clicos [place]rick[place]aumentan.  Tiene vmitos persistentes.  Comienza a Financial risk analystsentir dolor al orinar u Centex Corporationobserva sangre.  Tiene fiebre.  Nota que los movimientos del beb disminuyen.  Siente intensa debilidad o se marea.  Tiene dificultad para respirar con o sin dolor abdominal.  Siente un dolor de cabeza intenso junto al dolor abdominal.  Shelle Ironiene una secrecin vaginal anormal con dolor abdominal.  Tiene diarrea persistente.  El dolor abdominal sigue o empeora an despus de Field seismologisthacer reposo. ASEGRESE DE QUE:   Comprende estas  instrucciones.  Controlar su afeccin.  Recibir ayuda de inmediato si no mejora o si empeora. Esta informacin no tiene Theme park managercomo fin reemplazar el consejo del mdico. Asegrese de hacerle al mdico cualquier pregunta que tenga. Document Released: 10/11/2005 Document Revised: 02/02/2016 Elsevier Interactive Patient Education  2017 [employer]vier [employer] Dolor del ligamento redondo (Round Ligament Pain) El ligamento redondo es un cordn de msculo y tejido que sirve de sostn para Careers information officerel tero. Puede volverse una fuente de dolor durante el embarazo si se distiende o se torsiona a medida que el beb crece. Generalmente, el dolor Triad Hospitalsempieza en el segundo trimestre de [place]embarazo, y Software engineerpuede aparecer y Landscape architectdesaparecer hasta el momento del [place]parto. No se trata de un problema grave y no es perjudicial para el beb. El dolor del ligamento redondo suele ser agudo y punzante, y durar poco tiempo, pero tambin puede ser sordo, persistente y continuo. Se lo percibe en la regin inferior del abdomen o en la ingle. A menudo comienza en la zona ms profunda de la ingle y se extiende hacia regin externa de la cadera. El Software engineerdolor puede aparecer en los siguientes casos:  Al cambiar repentinamente de posicin.  Al darse vuelta en la cama.  Al toser o estornudar.  Al realizar actividad fsica. INSTRUCCIONES PARA EL CUIDADO EN EL HOGAR Controle su afeccin para ver si hay cambios. Siga estos pasos para Acupuncturistaliviar el dolor:  Cuando el dolor comience, reljese. Luego intente lo siguiente: ? Sentarse. ? Flexionar las rodillas hacia el abdomen. ? Acostarse de costado con una almohada debajo del abdomen y Eastman Chemicalotra entre las piernas. ? Sentarse en una baera con agua tibia durante 15 a 20minutos o hasta que el  dolor desaparezca.  Tome los medicamentos de venta libre y los recetados solamente como se lo haya indicado el mdico.  Haga movimientos lentos al sentarse y pararse.  No haga caminatas largas si le generan dolor.  Suspenda o reduzca las  actividades fsicas si Public relations account executive. SOLICITE ATENCIN MDICA SI:  El dolor no desaparece con Scientist, research (medical).  Tiene un dolor en la espalda que no tena antes.  El medicamento no resulta eficaz. SOLICITE ATENCIN MDICA DE INMEDIATO SI:  Tiene escalofros o fiebre.  Tiene contracciones uterinas.  Presenta hemorragia vaginal.  Siente nuseas o vmitos.  Tiene diarrea.  Siente dolor al ConocoPhillips. Esta informacin no tiene Theme park manager el consejo del mdico. Asegrese de hacerle al mdico cualquier pregunta que tenga. Document Released: 09/23/2008 Document Revised: 01/03/2012 Document Reviewed: 12/18/2014 Elsevier Interactive Patient Education  [employer].

## 2017-12-26 NOTE — MAU Note (Signed)
Had a little pain, thought that she was hunger, had some fruit, pain was worse.  Then felt like she needed use restroom, for BM  But nothing came out.

## 2017-12-27 LAB — GC/CHLAMYDIA PROBE AMP (~~LOC~~) NOT AT ARMC
CHLAMYDIA, DNA PROBE: NEGATIVE
NEISSERIA GONORRHEA: NEGATIVE

## 2017-12-30 ENCOUNTER — Other Ambulatory Visit (INDEPENDENT_AMBULATORY_CARE_PROVIDER_SITE_OTHER): Payer: Self-pay

## 2017-12-30 DIAGNOSIS — Z3492 Encounter for supervision of normal pregnancy, unspecified, second trimester: Secondary | ICD-10-CM

## 2017-12-30 LAB — POCT UA - MICROSCOPIC ONLY

## 2017-12-30 LAB — POCT URINALYSIS DIP (MANUAL ENTRY)
Bilirubin, UA: NEGATIVE
GLUCOSE UA: NEGATIVE mg/dL
Ketones, POC UA: NEGATIVE mg/dL
Nitrite, UA: NEGATIVE
Protein Ur, POC: NEGATIVE mg/dL
RBC UA: NEGATIVE
SPEC GRAV UA: 1.015 (ref 1.010–1.025)
UROBILINOGEN UA: 0.2 U/dL
pH, UA: 7.5 (ref 5.0–8.0)

## 2017-12-30 NOTE — Progress Notes (Signed)
u

## 2018-01-01 LAB — OBSTETRIC PANEL, INCLUDING HIV
Antibody Screen: NEGATIVE
BASOS ABS: 0 10*3/uL (ref 0.0–0.2)
Basos: 0 %
EOS (ABSOLUTE): 0.1 10*3/uL (ref 0.0–0.4)
Eos: 1 %
HEP B S AG: NEGATIVE
HIV Screen 4th Generation wRfx: NONREACTIVE
Hematocrit: 37 % (ref 34.0–46.6)
Hemoglobin: 12.6 g/dL (ref 11.1–15.9)
IMMATURE GRANS (ABS): 0.1 10*3/uL (ref 0.0–0.1)
IMMATURE GRANULOCYTES: 1 %
LYMPHS ABS: 1.3 10*3/uL (ref 0.7–3.1)
LYMPHS: 15 %
MCH: 30.4 pg (ref 26.6–33.0)
MCHC: 34.1 g/dL (ref 31.5–35.7)
MCV: 89 fL (ref 79–97)
Monocytes Absolute: 0.3 10*3/uL (ref 0.1–0.9)
Monocytes: 4 %
NEUTROS PCT: 79 %
Neutrophils Absolute: 6.7 10*3/uL (ref 1.4–7.0)
PLATELETS: 216 10*3/uL (ref 150–379)
RBC: 4.15 x10E6/uL (ref 3.77–5.28)
RDW: 13.7 % (ref 12.3–15.4)
RPR: NONREACTIVE
Rh Factor: POSITIVE
Rubella Antibodies, IGG: 4.84 index (ref >0.99)
WBC: 8.4 10*3/uL (ref 3.4–10.8)

## 2018-01-01 LAB — SICKLE CELL SCREEN: Sickle Cell Screen: NEGATIVE

## 2018-01-03 LAB — URINE CULTURE, OB REFLEX

## 2018-01-03 LAB — CULTURE, OB URINE

## 2018-01-06 ENCOUNTER — Ambulatory Visit (INDEPENDENT_AMBULATORY_CARE_PROVIDER_SITE_OTHER): Payer: Self-pay | Admitting: Student in an Organized Health Care Education/Training Program

## 2018-01-06 ENCOUNTER — Encounter: Payer: Self-pay | Admitting: Student in an Organized Health Care Education/Training Program

## 2018-01-06 ENCOUNTER — Other Ambulatory Visit (HOSPITAL_COMMUNITY)
Admission: RE | Admit: 2018-01-06 | Discharge: 2018-01-06 | Disposition: A | Discharge status: Home / Self Care | Payer: Self-pay | Source: Ambulatory Visit | Attending: Family Medicine | Admitting: Family Medicine

## 2018-01-06 VITALS — BP 102/58 | HR 74 | Temp 98.8°F | Wt 140.4 lb

## 2018-01-06 DIAGNOSIS — Z3A18 18 weeks gestation of pregnancy: Secondary | ICD-10-CM

## 2018-01-06 DIAGNOSIS — Z124 Encounter for screening for malignant neoplasm of cervix: Secondary | ICD-10-CM | POA: Insufficient documentation

## 2018-01-06 DIAGNOSIS — Z3482 Encounter for supervision of other normal pregnancy, second trimester: Secondary | ICD-10-CM

## 2018-01-06 NOTE — Patient Instructions (Addendum)
Schedule follow up to be seen in 4 weeks.  Segundo trimestre de Public Service Enterprise Groupembarazo Second Trimester of Pregnancy El segundo trimestre va desde la semana14 hasta la 27, desde el cuarto hasta el sexto mes, y suele ser el momento en el que mejor se siente. Su organismo se ha adaptado a Charity fundraiserestar embarazada, y comienza a Diplomatic Services operational officersentirse fsicamente mejor. En general, las nuseas matutinas han disminuido o han desaparecido completamente, puede tener ms energa y un aumento de apetito. El segundo trimestre es tambin la poca en la que el feto se desarrolla rpidamente. Hacia el final del sexto mes, el feto mide aproximadamente 9pulgadas (23cm) y pesa alrededor de 1 libras (700g). Es probable que sienta que el beb se Teacher, English as a foreign languagemueve (da pataditas) entre las 16 y 20semanas del Psychiatristembarazo. Cambios en el cuerpo durante el segundo trimestre Su cuerpo continua experimentando numerosos cambios durante su segundo trimestre. Estos cambios varan de [place] mujer a Liechtensteinotra.  Seguir American Standard Companiesaumentando de peso. Notar que la parte baja del abdomen sobresale.  Podrn aparecer las primeras Albertson'sestras en las caderas, el abdomen y las Larkspurmamas.  Es posible que tenga dolores de cabeza que pueden aliviarse con ciertos medicamentos. Los medicamentos que tome deben estar aprobados por el mdico.  Tal vez tenga necesidad de orinar con ms frecuencia porque el feto est ejerciendo presin sobre la vejiga.  Debido al Vanetta Muldersembarazo podr sentir Anthoney Haradaacidez estomacal con frecuencia.  Puede estar estreida, ya que ciertas hormonas enlentecen los movimientos de los msculos que New York Life Insuranceempujan los desechos a travs de los intestinos.  Pueden aparecer hemorroides o abultarse e hincharse las venas (venas varicosas).  Puede sentir dolor en la espalda. Esto se debe a: ? Aumento de peso. ? Las hormonas del Management consultantembarazo relajan las articulaciones en la pelvis. ? Un cambio en el peso y los msculos que ayudan a Pharmacologistmantener su equilibrio.  Sus pechos seguirn creciendo y se pondrn cada vez ms  sensibles.  Las Veterinary surgeonencas pueden sangrar y estar sensibles al cepillado y al hilo dental.  Pueden aparecer zonas oscuras o manchas (cloasma, mscara del Fredoniaembarazo) en el rostro. Esto probablemente se atenuar despus del nacimiento del beb.  Es posible que se forme una lnea oscura desde el ombligo hasta la zona del pubis (linea nigra). Esto probablemente se atenuar despus del nacimiento del beb.  Tal vez haya cambios en el cabello. Esto cambios pueden incluir su engrosamiento, crecimiento rpido y Allied Waste Industriescambios en la textura. Adems, a algunas mujeres se les cae el cabello durante o despus del embarazo, o tienen el cabello seco o fino. Lo ms probable es que el cabello se le normalice despus del nacimiento del beb.  Qu debe esperar en las visitas prenatales Durante una visita prenatal de rutina:  La pesarn para asegurarse de que usted y el feto estn creciendo normalmente.  Le tomarn la presin arterial.  Le medirn el abdomen para controlar el desarrollo del beb.  Se escucharn los latidos cardacos fetales.  Se evaluarn los resultados de los estudios solicitados en visitas anteriores.  El mdico puede preguntarle lo siguiente:  Cmo se siente.  Si siente los movimientos del beb.  Si ha tenido sntomas anormales, como prdida de lquido, Parkssangrado, dolores de cabeza intensos o clicos abdominales.  Si est consumiendo algn producto que contenga tabaco, como cigarrillos, tabaco de Theatre managermascar y Administrator, Civil Servicecigarrillos electrnicos.  Si tiene Colgate-Palmolivealguna pregunta.  Otros estudios que podrn realizarse durante el segundo trimestre incluyen lo siguiente:  Anlisis de sangre para detectar lo siguiente: ? Concentraciones de hierro bajas (anemia). ? Nivel  alto de azcar en la sangre que afecta a las mujeres embarazadas (diabetes gestacional) entre las semanas 24 y 10. ? Anticuerpos Rh. Esto es para detectar una protena en los glbulos rojos (factor Rh).  Anlisis de orina para detectar  infecciones, diabetes o protenas en la orina.  Una ecografa para confirmar que el beb crece y se desarrolla correctamente.  Una amniocentesis para diagnosticar posibles problemas genticos.  Estudios del feto para descartar espina bfida y sndrome de [place].  Prueba del VIH (virus de inmunodeficiencia humana). Los exmenes prenatales de rutina incluyen la prueba de deteccin del VIH, a menos que decida no Futures trader.  Siga estas indicaciones en su casa: Medicamentos  Siga las indicaciones del mdico en relacin con el uso de medicamentos. Durante el embarazo, hay medicamentos que pueden tomarse y otros que no.  Tome vitaminas prenatales que contengan por lo menos (?g) de cido flico.  Si est estreida, tome un laxante suave, si el mdico lo autoriza. Qu debe comer y beber  Meriel Flavors una dieta equilibrada que incluya gran cantidad de frutas y verduras frescas, cereales integrales, buenas fuentes de protenas como carnes Moore, huevos o tofu, y lcteos descremados. El mdico la ayudar a Production assistant, radio cantidad de peso que puede [place].  No coma carne cruda ni quesos sin cocinar. Estos elementos contienen grmenes que pueden causar defectos congnitos en el beb.  Si no consume muchos alimentos con calcio, hable con su mdico sobre si debera tomar un suplemento diario de calcio.  Limite el consumo de alimentos con alto contenido de grasas y azcares procesados, como alimentos fritos o dulces.  Para evitar el estreimiento: ? Bebe suficiente lquido para mantener la orina clara o de color amarillo plido. ? Consuma alimentos ricos en fibra, como frutas y verduras frescas, cereales integrales y frijoles. Actividad  Haga ejercicio solamente como se lo haya indicado el mdico. La mayora de las mujeres pueden continuar su rutina de ejercicios durante el [place]. Intente realizar como mnimo de actividad fsica por lo menos 5das a la semana. Deje de hacer  ejercicio si experimenta contracciones uterinas.  No levante objetos pesados, use zapatos de tacones bajos y [address].  Puede seguir [employer], a menos que el mdico le indique lo contrario. Alivio del dolor y del Dentist  Use un sostn que le brinde buen soporte para prevenir las molestias causadas por la sensibilidad en los pechos.  Dese baos de asiento con agua tibia para Engineer, materials o las molestias causadas por las hemorroides. Use una crema para las hemorroides si el mdico la autoriza.  Descanse con las piernas elevadas si tiene calambres o dolor de cintura.  Si tiene venas varicosas, use medias de descanso. Eleve los pies durante , 3 o 4veces por da. Limite el consumo de sal en su dieta. Cuidados prenatales  Escriba sus preguntas. Llvelas cuando concurra a las visitas prenatales.  Concurra a todas las visitas prenatales tal como se lo haya indicado el mdico. Esto es importante. Seguridad  Use el cinturn de seguridad en todo momento mientras conduce.  Haga una lista de los nmeros de telfono de Associate Professor, que [employer] nmeros de telfono de familiares, [place], el hospital y los departamentos de polica y bomberos. Instrucciones generales  Pdale al mdico que la derive a clases de educacin prenatal en su localidad. Debe comenzar a tomar las clases antes de que empiece el mes6 de [place].  Pida ayuda si tiene necesidades nutricionales o de asesoramiento durante  el embarazo. El mdico puede aconsejarla o derivarla a especialistas para que la ayuden con diferentes necesidades.  No se d baos de inmersin en agua caliente, baos turcos ni saunas.  No se haga duchas vaginales ni use tampones o toallas higinicas perfumadas.  No mantenga las piernas cruzadas durante [place].  Evite el contacto con las bandejas sanitarias de los gatos y la tierra que estos animales usan. Estos elementos contienen bacterias  que pueden causar defectos congnitos al beb y la posible prdida del feto debido a un aborto espontneo o muerte fetal.  Evite fumar, consumir hierbas, beber alcohol y tomar frmacos que no le hayan recetado. Las sustancias qumicas que estos productos contienen pueden afectar la formacin y el desarrollo del beb.  No consuma ningn producto que contenga nicotina o tabaco, como cigarrillos y Administrator, Civil Service. Si necesita ayuda para dejar de fumar, consulte al [employer] Express.  Visite a su dentista si an no lo ha Occupational hygienist. Use un cepillo de dientes blando para higienizarse los dientes y psese el hilo dental con suavidad. Comunquese con un mdico si:  Tiene mareos.  Siente clicos leves, presin en la pelvis o dolor persistente en el abdomen.  Tiene nuseas, vmitos o diarrea persistentes.  Brett Fairy secrecin vaginal con mal olor.  Siente dolor al ConocoPhillips. Solicite ayuda de inmediato si:  Tiene fiebre.  Tiene una prdida de lquido por la vagina.  Tiene sangrado o pequeas prdidas vaginales.  Siente dolor intenso o clicos en el abdomen.  Sube de peso o baja de peso rpidamente.  Tiene dificultad para respirar y siente dolor de pecho.  Sbitamente se le hinchan mucho el rostro, las [place], los tobillos, los pies o las piernas.  No ha sentido los movimientos del beb durante Georgianne Fick.  Siente un dolor de cabeza intenso que no se alivia al tomar [employer].  Nota cambios en la visin. Resumen  El segundo trimestre va desde la semana14 hasta la 27, desde el cuarto hasta el sexto mes. Es tambin una poca en la que el feto se desarrolla rpidamente.  Su organismo atraviesa por muchos cambios durante el [place]. Estos cambios varan de [place] a [place].  Evite fumar, consumir hierbas, beber alcohol y tomar frmacos que no le hayan recetado. Estas sustancias qumicas afectan la formacin y el desarrollo de su beb.  No consuma ningn producto que  contenga tabaco, lo que incluye cigarrillos, tabaco de Theatre manager y Administrator, Civil Service. Si necesita ayuda para dejar de fumar, consulte al mdico.  Comunquese con su mdico si tiene preguntas sobre esto. Concurra a todas las visitas prenatales tal como se lo haya indicado el mdico. Esto es importante. Esta informacin no tiene Theme park manager el consejo del mdico. Asegrese de hacerle al mdico cualquier pregunta que tenga. Document Released: 07/21/2005 Document Revised: 02/21/2017 Document Reviewed: 02/21/2017 Elsevier Interactive Patient Education  2018 [employer].

## 2018-01-06 NOTE — Progress Notes (Addendum)
Hayley Wolfe is a 37 y.o. yo G3P1011 at 53106w0d who presents for her initial prenatal visit. Pregnancy is planned She reports fatigue. She  is taking PNV. See flow sheet for details. She denies LOF, vaginal bleeding, discharge.  Patient has OB history of NSVD (her son is 37 years old) at term. No gHTN or GDM during that pregnancy. She also has a history of spontaneous abortion at about 9 weeks one year ago. No other miscarriages or pregnancies noted.  PMH, POBH, FH, meds, allergies and Social Hx reviewed.  Prenatal Exam: Gen: Well nourished, well developed.  No distress.  Vitals noted. HEENT: Normocephalic, atraumatic.  Neck supple without cervical lymphadenopathy, thyromegaly or thyroid nodules.  Fair dentition. CV: RRR no murmur, gallops or rubs Lungs: CTAB.  Normal respiratory effort without wheezes or rales. Abd: soft, NTND. +BS.  Uterus not appreciated above pelvis. GU: Normal external female genitalia without lesions.  Normal vaginal, well rugated without lesions. No vaginal discharge.  Bimanual exam: No adnexal mass or TTP. No CMT. Cervix is closed. Uterus size: palpable at level of umbilicus Ext: No clubbing, cyanosis or edema. Psych: Normal grooming and dress.  Not depressed or anxious appearing.  Normal thought content and process without flight of ideas or looseness of associations.  Assessment & Plan: 1) 37 y.o. yo [id number] at 921w1d via early ultrasound doing well.  Current pregnancy issues include history of adjustment disorder/anxiety/depression. Mood screening including GAD7 (score 2) and PHQ9 (score 0) was not concerning at today's visit. Dating is reliable. Prenatal labs reviewed, negative. Early glucola is not indicated.  PHQ-9 and Pregnancy Medical Home forms completed and reviewed. Anatomy scan ordered at today's visit. PAP completed today.  2) Hx adjustment d/o/Anxiety/Depression - continue to monitor. No symptoms at this time.  Bleeding and pain precautions  reviewed. Importance of prenatal vitamins reviewed.  Follow up in 4 weeks.

## 2018-01-10 LAB — CYTOLOGY - PAP
Diagnosis: NEGATIVE
HPV (WINDOPATH): NOT DETECTED

## 2018-01-11 ENCOUNTER — Encounter: Payer: Self-pay | Admitting: Student in an Organized Health Care Education/Training Program

## 2018-01-13 DIAGNOSIS — Z124 Encounter for screening for malignant neoplasm of cervix: Secondary | ICD-10-CM | POA: Insufficient documentation

## 2018-01-16 ENCOUNTER — Encounter: Payer: Self-pay | Admitting: Student in an Organized Health Care Education/Training Program

## 2018-02-07 ENCOUNTER — Ambulatory Visit (INDEPENDENT_AMBULATORY_CARE_PROVIDER_SITE_OTHER): Payer: Self-pay | Admitting: Student in an Organized Health Care Education/Training Program

## 2018-02-07 ENCOUNTER — Encounter: Payer: Self-pay | Admitting: Student in an Organized Health Care Education/Training Program

## 2018-02-07 ENCOUNTER — Other Ambulatory Visit: Payer: Self-pay

## 2018-02-07 VITALS — BP 120/78 | HR 85 | Temp 98.2°F | Wt 147.0 lb

## 2018-02-07 DIAGNOSIS — O4440 Low lying placenta NOS or without hemorrhage, unspecified trimester: Secondary | ICD-10-CM

## 2018-02-07 DIAGNOSIS — K219 Gastro-esophageal reflux disease without esophagitis: Secondary | ICD-10-CM

## 2018-02-07 MED ORDER — ESOMEPRAZOLE MAGNESIUM 40 MG PO CPDR: 40.0000 mg | DELAYED_RELEASE_CAPSULE | Freq: Every day | ORAL | 3 refills | Status: DC

## 2018-02-07 NOTE — Patient Instructions (Addendum)
It was a pleasure seeing you today in our clinic. Here is the treatment plan we have discussed and agreed upon together:  [facility] Pollie MeyerMcIntyre in 4 weeks. We can schedule your follow up ultrasound at that time.  Our clinic's number is [phone]. Please call with questions or concerns about what we discussed today.  Be well, Dr. Mosetta PuttFeng  Sign up for My Chart to have easy access to your labs results, and communication with your primary care physician.

## 2018-02-07 NOTE — Progress Notes (Signed)
Hayley CrosbyManuela Jarquin-Rios is a 37 y.o. G3P1011 at 6766w5d here for routine follow up.  She reports +FM, no LOF, no bleeding, no ctx.  See flow sheet for details.  A/P: Pregnancy at 9166w5d.  Doing well.   Pregnancy issues include:  1. Hx adjustment disorder/anxiety/depression: Asymptomatic. Continue to monitor.  2. Low-lying placenta: noted on anatomy U/S. Placenta 8mm from cervical os. Repeat U/S unable to be scheduled at this visit because it is too far in advanced. She will require follow up U/S between 30-32w (around 6/11).  3. Acid reflux: Nexium 40 mg daily prescribed today.  Anatomy scan reviewed, no other issues noted.  Preterm labor precautions reviewed. Follow up 4 weeks.

## 2018-03-07 ENCOUNTER — Ambulatory Visit (INDEPENDENT_AMBULATORY_CARE_PROVIDER_SITE_OTHER): Payer: Self-pay | Admitting: Student in an Organized Health Care Education/Training Program

## 2018-03-07 ENCOUNTER — Other Ambulatory Visit: Payer: Self-pay

## 2018-03-07 ENCOUNTER — Encounter: Payer: Self-pay | Admitting: Student in an Organized Health Care Education/Training Program

## 2018-03-07 VITALS — BP 100/60 | HR 75 | Temp 98.3°F | Wt 153.0 lb

## 2018-03-07 DIAGNOSIS — Z3482 Encounter for supervision of other normal pregnancy, second trimester: Secondary | ICD-10-CM

## 2018-03-07 DIAGNOSIS — Z3A26 26 weeks gestation of pregnancy: Secondary | ICD-10-CM

## 2018-03-07 DIAGNOSIS — Z3490 Encounter for supervision of normal pregnancy, unspecified, unspecified trimester: Secondary | ICD-10-CM | POA: Insufficient documentation

## 2018-03-07 DIAGNOSIS — O4440 Low lying placenta NOS or without hemorrhage, unspecified trimester: Secondary | ICD-10-CM

## 2018-03-07 LAB — POCT 1 HR PRENATAL GLUCOSE: GLUCOSE 1 HR PRENATAL, POC: 129 mg/dL

## 2018-03-07 NOTE — Patient Instructions (Addendum)
It was a pleasure seeing you today in our clinic.   Please schedule a visit in [facility] to be seen in 4 weeks.  Our clinic's number is [phone]. Please call with questions or concerns about what we discussed today.  Be well, Dr. Gilford Raid trimestre de embarazo Second Trimester of Pregnancy El segundo trimestre va desde la semana14 hasta la 27, desde el cuarto hasta el sexto mes, y suele ser el momento en el que mejor se siente. Su organismo se ha adaptado a Charity fundraiser, y comienza a Diplomatic Services operational officer. En general, las nuseas matutinas han disminuido o han desaparecido completamente, puede tener ms energa y un aumento de apetito. El segundo trimestre es tambin la poca en la que el feto se desarrolla rpidamente. Hacia el final del sexto mes, el feto mide aproximadamente 9pulgadas (23cm) y pesa alrededor de 1 libras (700g). Es probable que sienta que el beb se Teacher, English as a foreign language (da pataditas) entre las 16 y 20semanas del Psychiatrist. Cambios en el cuerpo durante el segundo trimestre Su cuerpo continua experimentando numerosos cambios durante su segundo trimestre. Estos cambios varan de Justice a [place].  Seguir [employer]. Notar que la parte baja del abdomen sobresale.  Podrn aparecer las primeras [employer] caderas, el abdomen y las [place].  Es posible que tenga dolores de cabeza que pueden aliviarse con ciertos medicamentos. Los medicamentos que tome deben estar aprobados por el mdico.  Tal vez tenga necesidad de orinar con ms frecuencia porque el feto est ejerciendo presin sobre la vejiga.  Debido al Vanetta Mulders podr sentir Anthoney Harada estomacal con frecuencia.  Puede estar estreida, ya que ciertas hormonas enlentecen los movimientos de los msculos que [employer] desechos a travs de los intestinos.  Pueden aparecer hemorroides o abultarse e hincharse las venas (venas varicosas).  Puede sentir dolor en la espalda. Esto se debe a: ? Aumento de  peso. ? Las hormonas del Management consultant las articulaciones en la pelvis. ? Un cambio en el peso y los msculos que ayudan a Pharmacologist su equilibrio.  Sus pechos seguirn creciendo y se pondrn cada vez ms sensibles.  Las Veterinary surgeon y estar sensibles al cepillado y al hilo dental.  Pueden aparecer zonas oscuras o manchas (cloasma, mscara del [place]) en el rostro. Esto probablemente se atenuar despus del nacimiento del beb.  Es posible que se forme una lnea oscura desde el ombligo hasta la zona del pubis (linea nigra). Esto probablemente se atenuar despus del nacimiento del beb.  Tal vez haya cambios en el cabello. Esto cambios pueden incluir su engrosamiento, crecimiento rpido y [employer] textura. Adems, a algunas mujeres se les cae el cabello durante o despus del embarazo, o tienen el cabello seco o fino. Lo ms probable es que el cabello se le normalice despus del nacimiento del beb.  Qu debe esperar en las visitas prenatales Durante una visita prenatal de rutina:  La pesarn para asegurarse de que usted y el feto estn creciendo normalmente.  Le tomarn la presin arterial.  Le medirn el abdomen para controlar el desarrollo del beb.  Se escucharn los latidos cardacos fetales.  Se evaluarn los resultados de los estudios solicitados en visitas anteriores.  El mdico puede preguntarle lo siguiente:  Cmo se siente.  Si siente los movimientos del beb.  Si ha tenido sntomas anormales, como prdida de lquido, [place], dolores de cabeza intensos o clicos abdominales.  Si est consumiendo algn producto que contenga tabaco, como cigarrillos,  tabaco de mascar y cigarrillos electrnicos.  Si tiene [employer].  Otros estudios que podrn realizarse durante el segundo trimestre incluyen lo siguiente:  Anlisis de sangre para detectar lo siguiente: ? Concentraciones de hierro bajas (anemia). ? Nivel alto de azcar en la sangre que afecta  a las mujeres embarazadas (diabetes gestacional) entre las semanas 24 y 15. ? Anticuerpos Rh. Esto es para detectar una protena en los glbulos rojos (factor Rh).  Anlisis de orina para detectar infecciones, diabetes o protenas en la orina.  Una ecografa para confirmar que el beb crece y se desarrolla correctamente.  Una amniocentesis para diagnosticar posibles problemas genticos.  Estudios del feto para descartar espina bfida y sndrome de [place].  Prueba del VIH (virus de inmunodeficiencia humana). Los exmenes prenatales de rutina incluyen la prueba de deteccin del VIH, a menos que decida no Futures trader.  Siga estas indicaciones en su casa: Medicamentos  Siga las indicaciones del mdico en relacin con el uso de medicamentos. Durante el embarazo, hay medicamentos que pueden tomarse y otros que no.  Tome vitaminas prenatales que contengan por lo menos (?g) de cido flico.  Si est estreida, tome un laxante suave, si el mdico lo autoriza. Qu debe comer y beber  Meriel Flavors una dieta equilibrada que incluya gran cantidad de frutas y verduras frescas, cereales integrales, buenas fuentes de protenas como carnes [place], huevos o tofu, y lcteos descremados. El mdico la ayudar a Production assistant, radio cantidad de peso que puede [place].  No coma carne cruda ni quesos sin cocinar. Estos elementos contienen grmenes que pueden causar defectos congnitos en el beb.  Si no consume muchos alimentos con calcio, hable con su mdico sobre si debera tomar un suplemento diario de calcio.  Limite el consumo de alimentos con alto contenido de grasas y azcares procesados, como alimentos fritos o dulces.  Para evitar el estreimiento: ? Bebe suficiente lquido para mantener la orina clara o de color amarillo plido. ? Consuma alimentos ricos en fibra, como frutas y verduras frescas, cereales integrales y frijoles. Actividad  Haga ejercicio solamente como se lo haya indicado el  mdico. La mayora de las mujeres pueden continuar su rutina de ejercicios durante el [place]. Intente realizar como mnimo de actividad fsica por lo menos 5das a la semana. Deje de hacer ejercicio si experimenta contracciones uterinas.  No levante objetos pesados, use zapatos de tacones bajos y [address].  Puede seguir [employer], a menos que el mdico le indique lo contrario. Alivio del dolor y del Dentist  Use un sostn que le brinde buen soporte para prevenir las molestias causadas por la sensibilidad en los pechos.  Dese baos de asiento con agua tibia para Engineer, materials o las molestias causadas por las hemorroides. Use una crema para las hemorroides si el mdico la autoriza.  Descanse con las piernas elevadas si tiene calambres o dolor de cintura.  Si tiene venas varicosas, use medias de descanso. Eleve los pies durante , 3 o 4veces por da. Limite el consumo de sal en su dieta. Cuidados prenatales  Escriba sus preguntas. Llvelas cuando concurra a las visitas prenatales.  Concurra a todas las visitas prenatales tal como se lo haya indicado el mdico. Esto es importante. Seguridad  Use el cinturn de seguridad en todo momento mientras conduce.  Haga una lista de los nmeros de telfono de Associate Professor, que [employer] nmeros de telfono de familiares, [place], el hospital y los departamentos de polica y bomberos. Instrucciones generales  Pdale al mdico que la derive a clases de educacin prenatal en su localidad. Debe comenzar a tomar las clases antes de que empiece el mes6 de [place].  Pida ayuda si tiene necesidades nutricionales o de asesoramiento Academic librarian. El mdico puede aconsejarla o derivarla a especialistas para que la ayuden con diferentes necesidades.  No se d baos de inmersin en agua caliente, baos turcos ni saunas.  No se haga duchas vaginales ni use tampones o toallas higinicas  perfumadas.  No mantenga las piernas cruzadas durante [place].  Evite el contacto con las bandejas sanitarias de los gatos y la tierra que estos animales usan. Estos elementos contienen bacterias que pueden causar defectos congnitos al beb y la posible prdida del feto debido a un aborto espontneo o muerte fetal.  Evite fumar, consumir hierbas, beber alcohol y tomar frmacos que no le hayan recetado. Las sustancias qumicas que estos productos contienen pueden afectar la formacin y el desarrollo del beb.  No consuma ningn producto que contenga nicotina o tabaco, como cigarrillos y Administrator, Civil Service. Si necesita ayuda para dejar de fumar, consulte al American Express.  Visite a su dentista si an no lo ha Occupational hygienist. Use un cepillo de dientes blando para higienizarse los dientes y psese el hilo dental con suavidad. Comunquese con un mdico si:  Tiene mareos.  Siente clicos leves, presin en la pelvis o dolor persistente en el abdomen.  Tiene nuseas, vmitos o diarrea persistentes.  Brett Fairy secrecin vaginal con mal olor.  Siente dolor al [employer]. Solicite ayuda de inmediato si:  Tiene fiebre.  Tiene una prdida de lquido por la vagina.  Tiene sangrado o pequeas prdidas vaginales.  Siente dolor intenso o clicos en el abdomen.  Sube de peso o baja de peso rpidamente.  Tiene dificultad para respirar y siente dolor de pecho.  Sbitamente se le hinchan mucho el rostro, las [place], los tobillos, los pies o las piernas.  No ha sentido los movimientos del beb durante Georgianne Fick.  Siente un dolor de cabeza intenso que no se alivia al tomar [employer].  Nota cambios en la visin. Resumen  El segundo trimestre va desde la semana14 hasta la 27, desde el cuarto hasta el sexto mes. Es tambin una poca en la que el feto se desarrolla rpidamente.  Su organismo atraviesa por muchos cambios durante el [place]. Estos cambios varan de [place] a  [place].  Evite fumar, consumir hierbas, beber alcohol y tomar frmacos que no le hayan recetado. Estas sustancias qumicas afectan la formacin y el desarrollo de su beb.  No consuma ningn producto que contenga tabaco, lo que incluye cigarrillos, tabaco de Theatre manager y Administrator, Civil Service. Si necesita ayuda para dejar de fumar, consulte al mdico.  Comunquese con su mdico si tiene preguntas sobre esto. Concurra a todas las visitas prenatales tal como se lo haya indicado el mdico. Esto es importante. Esta informacin no tiene Theme park manager el consejo del mdico. Asegrese de hacerle al mdico cualquier pregunta que tenga. Document Released: 07/21/2005 Document Revised: 02/21/2017 Document Reviewed: 02/21/2017 Elsevier Interactive Patient Education  2018 [employer].

## 2018-03-07 NOTE — Progress Notes (Signed)
Hayley Wolfe is a 37 y.o. G3P1011 at [redacted]w[redacted]d here for routine follow up.  She reports +FM, one mild irregular contraction but otherwise no contractions, no LOF or bleeding. See flow sheet for details.  A/P: Pregnancy at [redacted]w[redacted]d.  Doing well.   Pregnancy issues include:  1. Hx adjustment disorder/anxiety/depression: Continues to be asymptomatic. Continue to monitor.  2. Low-lying placenta: noted on anatomy U/S. Placenta 8mm from cervical os. Repeat U/S scheduled at today's visit for between 30-32w (around 6/11).  3. Acid reflux: Resolved. No longer using Nexium.  4. Irregular contraction: only occurred 1x. Preterm labor precautions reviewed.  5. GTT performed today. Also ordered HIV, RPR, CBC.   6. Letter given for patient to get TDAP at the health department ([facility]).  Preterm labor and fetal movement precautions reviewed. Follow up 4 weeks in [facility].

## 2018-03-08 LAB — CBC
HEMATOCRIT: 35 % (ref 34.0–46.6)
HEMOGLOBIN: 11.4 g/dL (ref 11.1–15.9)
MCH: 30 pg (ref 26.6–33.0)
MCHC: 32.6 g/dL (ref 31.5–35.7)
MCV: 92 fL (ref 79–97)
Platelets: 235 10*3/uL (ref 150–379)
RBC: 3.8 x10E6/uL (ref 3.77–5.28)
RDW: 12.8 % (ref 12.3–15.4)
WBC: 10.2 10*3/uL (ref 3.4–10.8)

## 2018-03-08 LAB — HIV ANTIBODY (ROUTINE TESTING W REFLEX): HIV Screen 4th Generation wRfx: NONREACTIVE

## 2018-03-08 LAB — RPR: RPR Ser Ql: NONREACTIVE

## 2018-04-13 ENCOUNTER — Ambulatory Visit (INDEPENDENT_AMBULATORY_CARE_PROVIDER_SITE_OTHER): Payer: Self-pay | Admitting: Family Medicine

## 2018-04-13 ENCOUNTER — Other Ambulatory Visit: Payer: Self-pay

## 2018-04-13 VITALS — BP 104/60 | HR 75 | Temp 98.2°F | Wt 161.0 lb

## 2018-04-13 DIAGNOSIS — Z3493 Encounter for supervision of normal pregnancy, unspecified, third trimester: Secondary | ICD-10-CM

## 2018-04-13 NOTE — Patient Instructions (Signed)

## 2018-04-13 NOTE — Progress Notes (Signed)
Hayley CrosbyManuela Wolfe is a 37 y.o. G3P1011 at 3866w0d for routine follow up.  She reports: B/L ankle pain and swelling intermittently. She has headache occasionally but not currently, no N/V. At times she feels like she has a big and numb tongue on and off, this is a feeling she has when eating. She denies episode prior to pregnancy and this has been on going for 3 weeks, she like her tongue is huge now.  See flow sheet for details.  A/P: Pregnancy at 1966w0d.  Doing well.  Baby is moving fine and a lot. Pregnancy issues include: See above  Infant feeding choice: Bottle and breast feeding.  Contraception choice: Not decided Infant circumcision desired not applicable  Tdapwas not given today. She already got it at the health department GBS/GC/CZ testing was not performed today.  No ankle swelling or tenderness today. Patient reassured this could be normal during pregnancy. Her tongue looks normal with normal sensation with tongue depressor. Her symptoms is likely related to hormonal change. Red flag symptoms such as tongue and facial weakness discussed. She is advised to go to the MAU if that happens.  Physical exam generally benign.  [facility] form completed without red flags.  Repeat anatomy scan completed 03/27/18. Record requested from the health department. I will review whenever they fax it. She got Tdap at the HD. We will update her record to reflect that. Labs reviewed.  Preterm labor precautions reviewed. Safe sleep discussed. Kick counts reviewed. Follow up 2 weeks.

## 2018-04-20 ENCOUNTER — Encounter: Payer: Self-pay | Admitting: Student in an Organized Health Care Education/Training Program

## 2018-04-26 ENCOUNTER — Encounter: Payer: Self-pay | Admitting: Family Medicine

## 2018-05-02 ENCOUNTER — Encounter: Payer: Self-pay | Admitting: Student in an Organized Health Care Education/Training Program

## 2018-05-02 ENCOUNTER — Ambulatory Visit (INDEPENDENT_AMBULATORY_CARE_PROVIDER_SITE_OTHER): Payer: Self-pay | Admitting: Student in an Organized Health Care Education/Training Program

## 2018-05-02 ENCOUNTER — Other Ambulatory Visit: Payer: Self-pay

## 2018-05-02 DIAGNOSIS — Z3493 Encounter for supervision of normal pregnancy, unspecified, third trimester: Secondary | ICD-10-CM

## 2018-05-02 DIAGNOSIS — Z3A35 35 weeks gestation of pregnancy: Secondary | ICD-10-CM

## 2018-05-02 NOTE — Patient Instructions (Signed)
It was a pleasure seeing you today in our clinic.  Our clinic's number is [phone]. Please call with questions or concerns about what we discussed today.  Be well, Dr. Mosetta Putt  Sign up for My Chart to have easy access to your labs results, and communication with your primary care physician.   Third Trimester of Pregnancy The third trimester is from week 29 through week 42, months 7 through 9. This trimester is when your unborn baby (fetus) is growing very fast. At the end of the ninth month, the unborn baby is about 20 inches in length. It weighs about 6-10 pounds. Follow these instructions at home:  Avoid all smoking, herbs, and alcohol. Avoid drugs not approved by your doctor.  Do not use any tobacco products, including cigarettes, chewing tobacco, and electronic cigarettes. If you need help quitting, ask your doctor. You may get counseling or other support to help you quit.  Only take medicine as told by your doctor. Some medicines are safe and some are not during pregnancy.  Exercise only as told by your doctor. Stop exercising if you start having cramps.  Eat regular, healthy meals.  Wear a good support bra if your breasts are tender.  Do not use hot tubs, steam rooms, or saunas.  Wear your seat belt when driving.  Avoid raw meat, uncooked cheese, and liter boxes and soil used by cats.  Take your prenatal vitamins.  Take 1500-2000 milligrams of calcium daily starting at the 20th week of pregnancy until you deliver your baby.  Try taking medicine that helps you poop (stool softener) as needed, and if your doctor approves. Eat more fiber by eating fresh fruit, vegetables, and whole grains. Drink enough fluids to keep your pee (urine) clear or pale yellow.  Take warm water baths (sitz baths) to soothe pain or discomfort caused by hemorrhoids. Use hemorrhoid cream if your doctor approves.  If you have puffy, bulging veins (varicose veins), wear support hose. Raise (elevate)  your feet for 15 minutes, 3-4 times a day. Limit salt in your diet.  Avoid heavy lifting, wear low heels, and sit up straight.  Rest with your legs raised if you have leg cramps or low back pain.  Visit your dentist if you have not gone during your pregnancy. Use a soft toothbrush to brush your teeth. Be gentle when you floss.  You can have sex (intercourse) unless your doctor tells you not to.  Do not travel far distances unless you must. Only do so with your doctor's approval.  Take prenatal classes.  Practice driving to the hospital.  Pack your hospital bag.  Prepare the baby's room.  Go to your doctor visits. Get help if:  You are not sure if you are in labor or if your water has broken.  You are dizzy.  You have mild cramps or pressure in your lower belly (abdominal).  You have a nagging pain in your belly area.  You continue to feel sick to your stomach (nauseous), throw up (vomit), or have watery poop (diarrhea).  You have bad smelling fluid coming from your vagina.  You have pain with peeing (urination). Get help right away if:  You have a fever.  You are leaking fluid from your vagina.  You are spotting or bleeding from your vagina.  You have severe belly cramping or pain.  You lose or gain weight rapidly.  You have trouble catching your breath and have chest pain.  You notice sudden or extreme puffiness (  swelling) of your face, hands, ankles, feet, or legs.  You have not felt the baby move in over an hour.  You have severe headaches that do not go away with medicine.  You have vision changes. This information is not intended to replace advice given to you by your health care provider. Make sure you discuss any questions you have with your health care provider. Document Released: 01/05/2010 Document Revised: 03/18/2016 Document Reviewed: 12/12/2012 Elsevier Interactive Patient Education  2017 [employer]

## 2018-05-02 NOTE — Progress Notes (Signed)
Providence CrosbyManuela Jarquin-Rios is a 37 y.o. G3P1011 at 173w5d here for routine follow up.  She reports fetal movement, no LOF, no bleeding, no contractions or cramping.. See flow sheet for details.  A/P: Pregnancy at 6673w5d.  Doing well.   Pregnancy issues include:  1. Hx adjustment disorder/anxiety/depression: Continues to be asymptomatic. Continue to monitor.  2. Low-lying placenta: WNL on 6/3 ultrasound  3. Supervision of normal pregancy - Infant feeding choice: bottle and breast - Contraception choice: undecided, discussed today. Patient to consider options. - Infant circumcision desired: not applicable - Tdap was not given today, she received at health department. - Preterm labor and fetal movement precautions reviewed. - Safe sleep discussed, patient already has a crib at home.  Follow up 2 weeks.

## 2018-05-18 ENCOUNTER — Encounter: Payer: Self-pay | Admitting: Student in an Organized Health Care Education/Training Program

## 2018-05-18 ENCOUNTER — Ambulatory Visit (INDEPENDENT_AMBULATORY_CARE_PROVIDER_SITE_OTHER): Payer: Self-pay | Admitting: Student in an Organized Health Care Education/Training Program

## 2018-05-18 ENCOUNTER — Other Ambulatory Visit (HOSPITAL_COMMUNITY)
Admission: RE | Admit: 2018-05-18 | Discharge: 2018-05-18 | Disposition: A | Discharge status: Home / Self Care | Payer: Self-pay | Source: Ambulatory Visit | Attending: Family Medicine | Admitting: Family Medicine

## 2018-05-18 VITALS — BP 100/60 | HR 88 | Temp 97.5°F | Wt 171.6 lb

## 2018-05-18 DIAGNOSIS — Z3A37 37 weeks gestation of pregnancy: Secondary | ICD-10-CM | POA: Insufficient documentation

## 2018-05-18 DIAGNOSIS — Z3493 Encounter for supervision of normal pregnancy, unspecified, third trimester: Secondary | ICD-10-CM

## 2018-05-18 DIAGNOSIS — O09523 Supervision of elderly multigravida, third trimester: Secondary | ICD-10-CM | POA: Insufficient documentation

## 2018-05-18 NOTE — Patient Instructions (Signed)

## 2018-05-18 NOTE — Progress Notes (Signed)
Hayley Wolfe is a 37 y.o. G3P1011 at 8679w0d here for routine follow up.  She reports +FM, no contractions, no LOF, no bleeding. She does have some lower back pain and cramping which is mild. See flow sheet for details.  History obtained with interpreter  A/P: Pregnancy at 5679w0d. Doing well.   Pregnancy issues include:  1. Hx adjustment disorder/anxiety/depression:Continues to be asymptomatic. Continue to monitor.  2. Low-lying placenta: resolved/WNL on 6/3 ultrasound  3. Supervision of normal pregancy - Infant feeding choice: breast and bottle - Contraception choice: undecided - Infant circumcision desired: not applicable - GBS and gc/chlamydia testing results were tested today  Labor and fetal movement precautions reviewed. Follow up 1 week.

## 2018-05-19 LAB — CERVICOVAGINAL ANCILLARY ONLY
CHLAMYDIA, DNA PROBE: NEGATIVE
Neisseria Gonorrhea: NEGATIVE

## 2018-05-22 LAB — CULTURE, BETA STREP (GROUP B ONLY): Strep Gp B Culture: NEGATIVE

## 2018-05-25 ENCOUNTER — Ambulatory Visit (INDEPENDENT_AMBULATORY_CARE_PROVIDER_SITE_OTHER): Payer: Self-pay | Admitting: Family Medicine

## 2018-05-25 ENCOUNTER — Other Ambulatory Visit: Payer: Self-pay

## 2018-05-25 VITALS — BP 100/60 | HR 76 | Temp 98.3°F | Wt 175.6 lb

## 2018-05-25 DIAGNOSIS — O1203 Gestational edema, third trimester: Secondary | ICD-10-CM

## 2018-05-25 DIAGNOSIS — Z3493 Encounter for supervision of normal pregnancy, unspecified, third trimester: Secondary | ICD-10-CM

## 2018-05-25 NOTE — Patient Instructions (Signed)
It was nice to meet you today!  If you have any contractions which occur 7-10 minutes apart, vaginal bleeding, fluid leaking, or are worried that baby is not moving well, [facility].  If headaches worsen, vision changes worsen, increased swelling, or abdominal pain please go to [facility] hospital to be evaluated.  Be well, Dr. Pollie MeyerMcIntyre

## 2018-05-26 LAB — PROTEIN / CREATININE RATIO, URINE
Creatinine, Urine: 26.9 mg/dL
PROTEIN UR: 5.7 mg/dL
Protein/Creat Ratio: 212 mg/g creat — ABNORMAL HIGH (ref 0–200)

## 2018-05-26 LAB — CMP14+EGFR
ALT: 14 IU/L (ref 0–32)
AST: 18 IU/L (ref 0–40)
Albumin/Globulin Ratio: 1.2 (ref 1.2–2.2)
Albumin: 3.3 g/dL — ABNORMAL LOW (ref 3.5–5.5)
Alkaline Phosphatase: 110 IU/L (ref 39–117)
BUN / CREAT RATIO: 17 (ref 9–23)
BUN: 11 mg/dL (ref 6–20)
Bilirubin Total: 0.5 mg/dL (ref 0.0–1.2)
CO2: 20 mmol/L (ref 20–29)
Calcium: 8.7 mg/dL (ref 8.7–10.2)
Chloride: 106 mmol/L (ref 96–106)
Creatinine, Ser: 0.63 mg/dL (ref 0.57–1.00)
GFR calc non Af Amer: 116 mL/min/{1.73_m2} (ref >59)
GFR, EST AFRICAN AMERICAN: 133 mL/min/{1.73_m2} (ref >59)
GLUCOSE: 94 mg/dL (ref 65–99)
Globulin, Total: 2.7 g/dL (ref 1.5–4.5)
Potassium: 4.2 mmol/L (ref 3.5–5.2)
Sodium: 140 mmol/L (ref 134–144)
TOTAL PROTEIN: 6 g/dL (ref 6.0–8.5)

## 2018-05-26 LAB — CBC
HEMATOCRIT: 36.4 % (ref 34.0–46.6)
HEMOGLOBIN: 11.6 g/dL (ref 11.1–15.9)
MCH: 28.4 pg (ref 26.6–33.0)
MCHC: 31.9 g/dL (ref 31.5–35.7)
MCV: 89 fL (ref 79–97)
Platelets: 181 10*3/uL (ref 150–450)
RBC: 4.08 x10E6/uL (ref 3.77–5.28)
RDW: 12.1 % — ABNORMAL LOW (ref 12.3–15.4)
WBC: 7.1 10*3/uL (ref 3.4–10.8)

## 2018-05-26 NOTE — Progress Notes (Signed)
[facility] Visit  [name] is a 37 y.o. G3P1011 at 9056w0d (via 16 week ultrasound) who [facility] for routine follow up. Prenatal course, history, notes, ultrasounds, and laboratory results reviewed. Spanish interpreter utilized during today's visit.   Denies cramping/ctx, fluid leaking, vaginal bleeding, or decreased fetal movement. Taking PNV.    Feet swelling - patient reports feet swollen for the last 2 weeks. On ROS admits to blurry vision with mild headaches that come and go. No abdominal pain. On further questioning the "blurry vision" is more that lights bother her now more than previously.  Primary Prenatal Care Provider: Dr. Mosetta PuttFeng  Postpartum plans: - feeding: breast - circ: n/a having a girl - contraception: paraguard IUD - pediatrician: [facility]  Pregnancy issues include:  1. History of anxiety/depression - mood good per patient. No SI or HI.  2. Low lying placenta, resolved on subsequent imaging  3. Feet swelling - with blurry vision and occasional mild headache. Blood pressure low normal today at 100/60. Out of abundance of caution will check PIH labs - urine P:C ratio, CMET, CBC. Follow up in 1 week. Reviewed PIH symptoms and return precautions with patient.   4. Routine care - vertex presentation confirmed via leopolds and limited bedside ultrasound - GBS result reviewed, negative - postpartum plans reviewed as above. - follow up in 1 week for next prenatal visit - labor & fetal movement precautions reviewed.  Hayley FeinsteinBrittany Burdette Gergely, MD [facility]

## 2018-06-02 ENCOUNTER — Ambulatory Visit (INDEPENDENT_AMBULATORY_CARE_PROVIDER_SITE_OTHER): Payer: Self-pay | Admitting: Family Medicine

## 2018-06-02 DIAGNOSIS — Z3493 Encounter for supervision of normal pregnancy, unspecified, third trimester: Secondary | ICD-10-CM

## 2018-06-02 NOTE — Patient Instructions (Addendum)
It was great seeing you today! I am glad that things have been going well. Your nausea, leg swelling, fatigue and other symptoms are normal for this stage of pregnancy. If you have not developed by 8/14, you will need to schedule an appointment with us to get set up to go to the hospital on 8/15.   Fue genial verte hoy! Me alegra que las cosas hayan ido bien. Sus nuseas, hinchazn de las piernas, fatiga y otros sntomas son normales para esta etapa del embarazo. Si no se ha desarrollado antes del 8/14, deber programar una cita con nosotros para prepararse para ir al hospital el 15/8.

## 2018-06-04 ENCOUNTER — Inpatient Hospital Stay (HOSPITAL_COMMUNITY): Payer: Medicaid Other | Admitting: Anesthesiology

## 2018-06-04 ENCOUNTER — Inpatient Hospital Stay (HOSPITAL_COMMUNITY)
Admission: AD | Admit: 2018-06-04 | Discharge: 2018-06-06 | DRG: 768 | Disposition: A | Discharge status: Home / Self Care | Payer: Medicaid Other | Attending: Obstetrics and Gynecology | Admitting: Obstetrics and Gynecology

## 2018-06-04 ENCOUNTER — Other Ambulatory Visit: Payer: Self-pay

## 2018-06-04 ENCOUNTER — Encounter (HOSPITAL_COMMUNITY): Payer: Self-pay

## 2018-06-04 DIAGNOSIS — Z8759 Personal history of other complications of pregnancy, childbirth and the puerperium: Secondary | ICD-10-CM

## 2018-06-04 DIAGNOSIS — Z3490 Encounter for supervision of normal pregnancy, unspecified, unspecified trimester: Secondary | ICD-10-CM

## 2018-06-04 DIAGNOSIS — D649 Anemia, unspecified: Secondary | ICD-10-CM | POA: Diagnosis present

## 2018-06-04 DIAGNOSIS — O9902 Anemia complicating childbirth: Secondary | ICD-10-CM | POA: Diagnosis present

## 2018-06-04 DIAGNOSIS — Z3483 Encounter for supervision of other normal pregnancy, third trimester: Secondary | ICD-10-CM | POA: Diagnosis present

## 2018-06-04 DIAGNOSIS — Z3A39 39 weeks gestation of pregnancy: Secondary | ICD-10-CM

## 2018-06-04 LAB — CBC
HEMATOCRIT: 32.8 % — AB (ref 36.0–46.0)
Hemoglobin: 11 g/dL — ABNORMAL LOW (ref 12.0–15.0)
MCH: 28.9 pg (ref 26.0–34.0)
MCHC: 33.5 g/dL (ref 30.0–36.0)
MCV: 86.1 fL (ref 78.0–100.0)
Platelets: 174 10*3/uL (ref 150–400)
RBC: 3.81 MIL/uL — ABNORMAL LOW (ref 3.87–5.11)
RDW: 13.3 % (ref 11.5–15.5)
WBC: 8.8 10*3/uL (ref 4.0–10.5)

## 2018-06-04 LAB — TYPE AND SCREEN
ABO/RH(D): B POS
ANTIBODY SCREEN: NEGATIVE

## 2018-06-04 LAB — AMNISURE RUPTURE OF MEMBRANE (ROM) NOT AT ARMC: AMNISURE: POSITIVE

## 2018-06-04 LAB — POCT FERN TEST: POCT Fern Test: NEGATIVE

## 2018-06-04 MED ORDER — SODIUM CHLORIDE 0.9% FLUSH: 3.0000 mL | INTRAVENOUS | Status: DC | PRN

## 2018-06-04 MED ORDER — SENNOSIDES-DOCUSATE SODIUM 8.6-50 MG PO TABS
2.0000 | ORAL_TABLET | ORAL | Status: DC
  Administered 2018-06-05 (×2): 2 via ORAL
  Filled 2018-06-04 (×2): qty 2

## 2018-06-04 MED ORDER — WITCH HAZEL-GLYCERIN EX PADS: 1.0000 "application " | MEDICATED_PAD | CUTANEOUS | Status: DC | PRN

## 2018-06-04 MED ORDER — SODIUM CHLORIDE 0.9 % IV SOLN: 250.0000 mL | INTRAVENOUS | Status: DC | PRN

## 2018-06-04 MED ORDER — LACTATED RINGERS IV SOLN
500.0000 mL | Freq: Once | INTRAVENOUS | Status: AC
  Administered 2018-06-04: 1000 mL via INTRAVENOUS

## 2018-06-04 MED ORDER — OXYCODONE-ACETAMINOPHEN 5-325 MG PO TABS: 1.0000 | ORAL_TABLET | ORAL | Status: DC | PRN

## 2018-06-04 MED ORDER — TETANUS-DIPHTH-ACELL PERTUSSIS 5-2.5-18.5 LF-MCG/0.5 IM SUSP: 0.5000 mL | Freq: Once | INTRAMUSCULAR | Status: DC

## 2018-06-04 MED ORDER — ACETAMINOPHEN 325 MG PO TABS
650.0000 mg | ORAL_TABLET | ORAL | Status: DC | PRN
  Administered 2018-06-05 (×4): 650 mg via ORAL
  Filled 2018-06-04 (×4): qty 2

## 2018-06-04 MED ORDER — OXYCODONE-ACETAMINOPHEN 5-325 MG PO TABS: 2.0000 | ORAL_TABLET | ORAL | Status: DC | PRN

## 2018-06-04 MED ORDER — OXYTOCIN BOLUS FROM INFUSION
500.0000 mL | Freq: Once | INTRAVENOUS | Status: AC
  Administered 2018-06-04: 500 mL via INTRAVENOUS

## 2018-06-04 MED ORDER — DOCUSATE SODIUM 100 MG PO CAPS
100.0000 mg | ORAL_CAPSULE | Freq: Two times a day (BID) | ORAL | Status: DC
  Administered 2018-06-04 – 2018-06-06 (×4): 100 mg via ORAL
  Filled 2018-06-04 (×4): qty 1

## 2018-06-04 MED ORDER — ONDANSETRON HCL 4 MG/2ML IJ SOLN: 4.0000 mg | Freq: Four times a day (QID) | INTRAMUSCULAR | Status: DC | PRN

## 2018-06-04 MED ORDER — DIBUCAINE 1 % RE OINT: 1.0000 "application " | TOPICAL_OINTMENT | RECTAL | Status: DC | PRN

## 2018-06-04 MED ORDER — MEASLES, MUMPS & RUBELLA VAC ~~LOC~~ INJ
0.5000 mL | INJECTION | Freq: Once | SUBCUTANEOUS | Status: DC
  Filled 2018-06-04: qty 0.5

## 2018-06-04 MED ORDER — OXYCODONE HCL 5 MG PO TABS: 5.0000 mg | ORAL_TABLET | ORAL | Status: DC | PRN

## 2018-06-04 MED ORDER — FENTANYL 2.5 MCG/ML BUPIVACAINE 1/10 % EPIDURAL INFUSION (WH - ANES)
14.0000 mL/h | INTRAMUSCULAR | Status: DC | PRN
  Administered 2018-06-04: 11 mL/h via EPIDURAL
  Administered 2018-06-04: 14 mL/h via EPIDURAL
  Filled 2018-06-04: qty 100

## 2018-06-04 MED ORDER — LIDOCAINE HCL (PF) 1 % IJ SOLN
INTRAMUSCULAR | Status: DC | PRN
  Administered 2018-06-04: 3 mL via EPIDURAL
  Administered 2018-06-04: 4 mL via EPIDURAL

## 2018-06-04 MED ORDER — EPHEDRINE 5 MG/ML INJ
10.0000 mg | INTRAVENOUS | Status: DC | PRN
  Filled 2018-06-04: qty 2

## 2018-06-04 MED ORDER — OXYTOCIN 40 UNITS IN LACTATED RINGERS INFUSION - SIMPLE MED
1.0000 m[IU]/min | INTRAVENOUS | Status: DC
  Administered 2018-06-04: 2 m[IU]/min via INTRAVENOUS
  Filled 2018-06-04: qty 1000

## 2018-06-04 MED ORDER — ONDANSETRON HCL 4 MG/2ML IJ SOLN: 4.0000 mg | INTRAMUSCULAR | Status: DC | PRN

## 2018-06-04 MED ORDER — PHENYLEPHRINE 40 MCG/ML (10ML) SYRINGE FOR IV PUSH (FOR BLOOD PRESSURE SUPPORT)
80.0000 ug | PREFILLED_SYRINGE | INTRAVENOUS | Status: DC | PRN
  Filled 2018-06-04: qty 10
  Filled 2018-06-04: qty 5

## 2018-06-04 MED ORDER — LACTATED RINGERS IV SOLN: 500.0000 mL | INTRAVENOUS | Status: DC | PRN

## 2018-06-04 MED ORDER — SOD CITRATE-CITRIC ACID 500-334 MG/5ML PO SOLN: 30.0000 mL | ORAL | Status: DC | PRN

## 2018-06-04 MED ORDER — DIPHENHYDRAMINE HCL 50 MG/ML IJ SOLN: 12.5000 mg | INTRAMUSCULAR | Status: DC | PRN

## 2018-06-04 MED ORDER — TERBUTALINE SULFATE 1 MG/ML IJ SOLN
0.2500 mg | Freq: Once | INTRAMUSCULAR | Status: DC | PRN
  Filled 2018-06-04: qty 1

## 2018-06-04 MED ORDER — LACTATED RINGERS IV SOLN: 500.0000 mL | Freq: Once | INTRAVENOUS | Status: DC

## 2018-06-04 MED ORDER — COCONUT OIL OIL
1.0000 "application " | TOPICAL_OIL | Status: DC | PRN
  Administered 2018-06-05: 1 via TOPICAL
  Filled 2018-06-04 (×2): qty 120

## 2018-06-04 MED ORDER — DIPHENHYDRAMINE HCL 25 MG PO CAPS: 25.0000 mg | ORAL_CAPSULE | Freq: Four times a day (QID) | ORAL | Status: DC | PRN

## 2018-06-04 MED ORDER — ACETAMINOPHEN 325 MG PO TABS: 650.0000 mg | ORAL_TABLET | ORAL | Status: DC | PRN

## 2018-06-04 MED ORDER — LACTATED RINGERS IV SOLN
INTRAVENOUS | Status: DC
  Administered 2018-06-04 (×3): via INTRAVENOUS

## 2018-06-04 MED ORDER — OXYTOCIN 40 UNITS IN LACTATED RINGERS INFUSION - SIMPLE MED
2.5000 [IU]/h | INTRAVENOUS | Status: DC
  Administered 2018-06-04: 2.5 [IU]/h via INTRAVENOUS

## 2018-06-04 MED ORDER — IBUPROFEN 600 MG PO TABS
600.0000 mg | ORAL_TABLET | Freq: Four times a day (QID) | ORAL | Status: DC
  Administered 2018-06-05 – 2018-06-06 (×7): 600 mg via ORAL
  Filled 2018-06-04 (×7): qty 1

## 2018-06-04 MED ORDER — ZOLPIDEM TARTRATE 5 MG PO TABS: 5.0000 mg | ORAL_TABLET | Freq: Every evening | ORAL | Status: DC | PRN

## 2018-06-04 MED ORDER — FENTANYL CITRATE (PF) 100 MCG/2ML IJ SOLN: 100.0000 ug | INTRAMUSCULAR | Status: DC | PRN

## 2018-06-04 MED ORDER — PRENATAL MULTIVITAMIN CH
1.0000 | ORAL_TABLET | Freq: Every day | ORAL | Status: DC
  Administered 2018-06-05 – 2018-06-06 (×2): 1 via ORAL
  Filled 2018-06-04 (×2): qty 1

## 2018-06-04 MED ORDER — SIMETHICONE 80 MG PO CHEW: 80.0000 mg | CHEWABLE_TABLET | ORAL | Status: DC | PRN

## 2018-06-04 MED ORDER — PHENYLEPHRINE 40 MCG/ML (10ML) SYRINGE FOR IV PUSH (FOR BLOOD PRESSURE SUPPORT)
80.0000 ug | PREFILLED_SYRINGE | INTRAVENOUS | Status: DC | PRN
  Filled 2018-06-04: qty 5

## 2018-06-04 MED ORDER — CEFAZOLIN SODIUM-DEXTROSE 2-4 GM/100ML-% IV SOLN
2.0000 g | Freq: Once | INTRAVENOUS | Status: AC
  Administered 2018-06-04: 2 g via INTRAVENOUS
  Filled 2018-06-04: qty 100

## 2018-06-04 MED ORDER — OXYCODONE HCL 5 MG PO TABS: 10.0000 mg | ORAL_TABLET | ORAL | Status: DC | PRN

## 2018-06-04 MED ORDER — ONDANSETRON HCL 4 MG PO TABS: 4.0000 mg | ORAL_TABLET | ORAL | Status: DC | PRN

## 2018-06-04 MED ORDER — BENZOCAINE-MENTHOL 20-0.5 % EX AERO
1.0000 "application " | INHALATION_SPRAY | CUTANEOUS | Status: DC | PRN
  Administered 2018-06-04 – 2018-06-06 (×2): 1 via TOPICAL
  Filled 2018-06-04 (×2): qty 56

## 2018-06-04 MED ORDER — POLYETHYLENE GLYCOL 3350 17 G PO PACK
17.0000 g | PACK | Freq: Two times a day (BID) | ORAL | Status: DC
  Administered 2018-06-04 – 2018-06-06 (×4): 17 g via ORAL
  Filled 2018-06-04 (×6): qty 1

## 2018-06-04 MED ORDER — LIDOCAINE HCL (PF) 1 % IJ SOLN
30.0000 mL | INTRAMUSCULAR | Status: AC | PRN
  Administered 2018-06-04: 30 mL via SUBCUTANEOUS
  Filled 2018-06-04: qty 30

## 2018-06-04 MED ORDER — SODIUM CHLORIDE 0.9% FLUSH: 3.0000 mL | Freq: Two times a day (BID) | INTRAVENOUS | Status: DC

## 2018-06-04 NOTE — MAU Note (Signed)
Pt. States her water broke and she is having ctx every 10 min a part. States the fluid was light pink and clear. Reports ctx started between 2-3am. +FM

## 2018-06-04 NOTE — MAU Note (Signed)
ctxs for 2 hours. Leaked alittle pink fld and some white mucous.

## 2018-06-04 NOTE — Progress Notes (Signed)
Hayley CrosbyManuela Wolfe is a 37 y.o. G3P1011 at 10316w3d admitted for SOL.  Objective: BP 106/61   Pulse 80   Temp 97.6 F (36.4 C) (Oral)   Resp 18   Ht 5\' 1"  (1.549 m)   Wt 80.3 kg   LMP 09/09/2017   SpO2 100%   BMI 33.44 kg/m  No intake/output data recorded. No intake/output data recorded.  FHT:  FHR: 135 bpm, variability: moderate,  accelerations:  Present,  decelerations:  1 variable decel UC:   regular, every 2-4 minutes SVE:   Dilation: 6.5 Effacement (%): 90 Station: -2 Exam by:: Earlene Plateravis, RN   Labs: Lab Results  Component Value Date   WBC 8.8 06/04/2018   HGB 11.0 (L) 06/04/2018   HCT 32.8 (L) 06/04/2018   MCV 86.1 06/04/2018   PLT 174 06/04/2018    Assessment / Plan: Spontaneous labor, progressing normally. One variable decel that improved spontaneously with 30 minutes of Cat 1 FHT following.  Labor: Progressing normally Preeclampsia:  no signs or symptoms of toxicity Fetal Wellbeing:  Category I ROM: SROm 0730 Pain Control:  Epidural I/D:  GBS neg Anticipated MOD:  NSVD   Hayley SchatzPatricia Else Habermann, DO Family Medicine, PGY-3

## 2018-06-04 NOTE — OB Triage Provider Note (Signed)
None      S: [name] is a 37 y.o. G3P1011 at 5635w3d  who presents to [facility] today complaining of leaking of fluid since 0200. She denies vaginal bleeding. She endorses contractions. She reports normal fetal movement.    O: BP 118/79   Pulse 70   Temp 97.9 F (36.6 C)   Resp 18   Ht 5\' 1"  (1.549 m)   Wt 80.3 kg   LMP 09/09/2017   BMI 33.44 kg/m  GENERAL: Well-developed, well-nourished female in no acute distress.  HEAD: Normocephalic, atraumatic.  CHEST: Normal effort of breathing, regular heart rate  Cervical exam:  Dilation: 4 Effacement (%): 70 Cervical Position: Middle Station: -3 Presentation: Vertex Exam by:: Julien NordmannMadison Lomax, RN    Fetal Monitoring: Baseline: 120 Variability: moderate  Accelerations: 15x15 Decelerations: variable  Contractions: irregular   Results for orders placed or performed during the hospital encounter of 06/04/18 (from the past 24 hour(s))  Amnisure rupture of membrane (rom)[facility]     Status: None   Collection Time: 06/04/18  5:53 AM  Result Value Ref Range   Amnisure ROM POSITIVE   POCT fern test     Status: Normal   Collection Time: 06/04/18  6:09 AM  Result Value Ref Range   POCT Fern Test Negative = intact amniotic membranes   CBC     Status: Abnormal   Collection Time: 06/04/18  7:08 AM  Result Value Ref Range   WBC 8.8 4.0 - 10.5 K/uL   RBC 3.81 (L) 3.87 - 5.11 MIL/uL   Hemoglobin 11.0 (L) 12.0 - 15.0 g/dL   HCT 32.432.8 (L) 40.136.0 - 02.746.0 %   MCV 86.1 78.0 - 100.0 fL   MCH 28.9 26.0 - 34.0 pg   MCHC 33.5 30.0 - 36.0 g/dL   RDW 25.313.3 66.411.5 - 40.315.5 %   Platelets 174 150 - 400 K/uL     A: SIUP at 9035w3d  SROM  P: Amnisure positive  Admit to Labor  Venia Carbonasch, Jennifer I, NP 06/04/2018 7:47 AM

## 2018-06-04 NOTE — Anesthesia Procedure Notes (Signed)
Epidural Patient location during procedure: OB Start time: 06/04/2018 9:42 AM End time: 06/04/2018 9:50 AM  Staffing Anesthesiologist: Mal AmabileFoster, Demontre Padin, MD Performed: anesthesiologist   Preanesthetic Checklist Completed: patient identified, site marked, surgical consent, pre-op evaluation, timeout performed, IV checked, risks and benefits discussed and monitors and equipment checked  Epidural Patient position: sitting Prep: site prepped and draped and DuraPrep Patient monitoring: continuous pulse ox and blood pressure Approach: midline Location: L3-L4 Injection technique: LOR air  Needle:  Needle type: Tuohy  Needle gauge: 17 G Needle length: 9 cm and 9 Needle insertion depth: 5 cm cm Catheter type: closed end flexible Catheter size: 19 Gauge Catheter at skin depth: 10 cm Test dose: negative and Other  Assessment Events: blood not aspirated, injection not painful, no injection resistance, negative IV test and no paresthesia  Additional Notes Patient identified. Risks and benefits discussed including failed block, incomplete  Pain control, post dural puncture headache, nerve damage, paralysis, blood pressure Changes, nausea, vomiting, reactions to medications-both toxic and allergic and post Partum back pain. All questions were answered. Patient expressed understanding and wished to proceed. Sterile technique was used throughout procedure. Epidural site was Dressed with sterile barrier dressing. No paresthesias, signs of intravascular injection Or signs of intrathecal spread were encountered.  Patient was more comfortable after the epidural was dosed. Please see RN's note for documentation of vital signs and FHR which are stable.

## 2018-06-04 NOTE — Anesthesia Preprocedure Evaluation (Signed)
Anesthesia Evaluation  Patient identified by MRN, date of birth, ID band Patient awake    Reviewed: Allergy & Precautions, Patient's Chart, lab work & pertinent test results  Airway Mallampati: II  TM Distance: >3 FB Neck ROM: Full    Dental no notable dental hx. (+) Teeth Intact   Pulmonary neg pulmonary ROS,    Pulmonary exam normal breath sounds clear to auscultation       Cardiovascular negative cardio ROS Normal cardiovascular exam Rhythm:Regular Rate:Normal     Neuro/Psych negative neurological ROS  negative psych ROS   GI/Hepatic Neg liver ROS, GERD  ,  Endo/Other  Obesity  Renal/GU negative Renal ROS  negative genitourinary   Musculoskeletal negative musculoskeletal ROS (+)   Abdominal (+) + obese,   Peds  Hematology  (+) anemia ,   Anesthesia Other Findings   Reproductive/Obstetrics (+) Pregnancy                             Anesthesia Physical Anesthesia Plan  ASA: II  Anesthesia Plan: Epidural   Post-op Pain Management:    Induction:   PONV Risk Score and Plan:   Airway Management Planned: Natural Airway  Additional Equipment:   Intra-op Plan:   Post-operative Plan:   Informed Consent: I have reviewed the patients History and Physical, chart, labs and discussed the procedure including the risks, benefits and alternatives for the proposed anesthesia with the patient or authorized representative who has indicated his/her understanding and acceptance.       Plan Discussed with: Anesthesiologist  Anesthesia Plan Comments:         Anesthesia Quick Evaluation  

## 2018-06-04 NOTE — Lactation Note (Signed)
This note was copied from a baby's chart. Lactation Consultation Note  Patient Name: Hayley Wolfe [id number]'YToday's Date: 06/04/2018 Reason for consult: Initial assessment;Primapara;1st time breastfeeding;Term  P1 mother whose infant is now 665 hours old.  RN requested latch assistance.  Raquel, interpreter, at bedside.  RN assisting mother as I arrived.  Baby is very sleepy and did not latch after delivery.  Mother's breasts are soft and non tender with flat nipples bilaterally.  Attempted to awaken infant and latch her onto the right breast in the football hold.  She did cooperate by opening her mouth but showed no interest in trying to suck.  She remained sleepy.  Reassured mother this was behavior was normal for an infant her age of life.  Encouraged to feed 8-12 times/24 hours or sooner if baby shows feeding cues.  Demonstrated breast massage and hand expression was taught by RN.  Mother was able to express colostrum drops which were finger fed back to baby.  Colostrum container provided for any EBM mother may obtain with hand expression.    Breast shells and manual pump provided with instructions.  #24 flange size is appropriate at this time.  Mother felt no pain with hand pump and understands the importance of using both tools to help evert nipples.  She will begin using the breast shells when she puts her bra on later.  Mother doing STS with baby and encouraged to continue while awake.  Mother will call for latch assistance as needed.  Family present and supportive.    Mom made aware of O/P services, breastfeeding support groups, community resources, and our phone # for post-discharge questions. RN aware of plan and will continue to help throughout the night.   Maternal Data Formula Feeding for Exclusion: No Has patient been taught Hand Expression?: Yes Does the patient have breastfeeding experience prior to this delivery?: No  Feeding Feeding Type: Breast Fed Length of feed: 0  min  LATCH Score Latch: Too sleepy or reluctant, no latch achieved, no sucking elicited.  Audible Swallowing: None  Type of Nipple: Flat  Comfort (Breast/Nipple): Soft / non-tender  Hold (Positioning): Assistance needed to correctly position infant at breast and maintain latch.  LATCH Score: 4  Interventions Interventions: Breast feeding basics reviewed;Assisted with latch;Skin to skin;Breast massage;Hand express;Pre-pump if needed;Expressed milk;Position options;Support pillows;Adjust position;Breast compression;Shells;Hand pump  Lactation Tools Discussed/Used     Consult Status Consult Status: Follow-up Date: 06/05/18 Follow-up type: In-patient    Alia Parsley R Emeka Lindner 06/04/2018, 11:08 PM

## 2018-06-04 NOTE — Anesthesia Pain Management Evaluation Note (Signed)
  CRNA Pain Management Visit Note  Patient: Hayley Wolfe, 37 y.o., female  "Hello I am a member of the anesthesia team at [facility]. We have an anesthesia team available at all times to provide care throughout the hospital, including epidural management and anesthesia for C-section. I don't know your plan for the delivery whether it a natural birth, water birth, IV sedation, nitrous supplementation, doula or epidural, but we want to meet your pain goals."   1.Was your pain managed to your expectations on prior hospitalizations?   Yes   2.What is your expectation for pain management during this hospitalization?     Epidural  3.How can we help you reach that goal? unsure  Record the patient's initial score and the patient's pain goal.   Pain: 6  Pain Goal: 8 [facility] wants you to be able to say your pain was always managed very well.  Cephus ShellingBURGER,Djimon Lundstrom 06/04/2018

## 2018-06-04 NOTE — H&P (Signed)
[name] is a 37 y.o. G3P1011 @[redacted]w[redacted]d  [facility] presenting for active labor at term with SROM.   [facility] Prenatal Labs  Dating LMP Blood type: B/Positive/-- (03/08 1045)   Genetic Screen 1 Screen:    AFP:     Quad:     NIPS: Antibody:Negative (03/08 1045)  Anatomic US  initially thought to have low lying placenta, resolved on repeat 6/3 Rubella: 4.84 (03/08 1045)  GTT 1 hr WNL RPR: Non Reactive (03/08 1045)   Flu vaccine  HBsAg: Negative (03/08 1045)   TDaP vaccine  at health department                                      Rhogam: HIV: Non Reactive (03/08 1045)   Baby Food   Breast and bottle                                            GBS: (For PCN allergy, check sensitivities)  Contraception undecided Pap:  Circumcision N/A   Pediatrician  CF:  Support Person  SMA  Prenatal Classes  Hgb electrophoresis:    OB History    Gravida  3   Para  1   Term  1   Preterm  0   AB  1   Living  1     SAB  1   TAB      Ectopic      Multiple      Live Births  1          Past Medical History:  Diagnosis Date  . Medical history non-contributory    Past Surgical History:  Procedure Laterality Date  . APPENDECTOMY     Family History: family history is not on file. Social History:  reports that she has never smoked. She has never used smokeless tobacco. She reports that she does not drink alcohol or use drugs.     Maternal Diabetes: No Genetic Screening: Declined Maternal Ultrasounds/Referrals: Normal Fetal Ultrasounds or other Referrals:  None Maternal Substance Abuse:  No Significant Maternal Medications:  None Significant Maternal Lab Results:  Lab values include: Group B Strep negative Other Comments:  None  Review of Systems  Constitutional: Negative for chills and fever.  Respiratory: Negative for shortness of breath.   Cardiovascular: Negative for chest pain.  Gastrointestinal: Positive for abdominal pain. Negative for constipation,  diarrhea and vomiting.  Neurological: Negative for dizziness and headaches.  All other systems reviewed and are negative.  Maternal Medical History:  Reason for admission: Rupture of membranes and contractions.   Contractions: Onset was 3-5 hours ago.   Frequency: regular.   Duration is approximately 1 minute.   Perceived severity is moderate.    Fetal activity: Perceived fetal activity is normal.   Last perceived fetal movement was within the past hour.    Prenatal complications: no prenatal complications Prenatal Complications - Diabetes: none.    Dilation: 4 Effacement (%): 70 Station: -3 Exam by:: [employer] AssociationMadison Lomax, RN  Blood pressure 119/70, pulse 71, temperature 97.9 F (36.6 C), resp. rate 18, height 5\' 1"  (1.549 m), weight 80.3 kg, last menstrual period 09/09/2017. Maternal Exam:  Uterine Assessment: Contraction strength is mild.  Contraction frequency is regular.   Abdomen: Fetal presentation: vertex  Cervix: Cervix  evaluated by digital exam.     Fetal Exam Fetal Monitor Review: Mode: ultrasound.   Baseline rate: 135.  Variability: moderate (6-25 bpm).   Pattern: accelerations present and no decelerations.    Fetal State Assessment: Category I - tracings are normal.     Physical Exam  Nursing note and vitals reviewed. Constitutional: She is oriented to person, place, and time. She appears well-developed and well-nourished.  Neck: Normal range of motion.  Cardiovascular: Normal rate and regular rhythm.  Respiratory: Effort normal and breath sounds normal.  GI: Soft.  Musculoskeletal: Normal range of motion.  Neurological: She is alert and oriented to person, place, and time.  Skin: Skin is warm and dry.  Psychiatric: She has a normal mood and affect. Her behavior is normal. Judgment and thought content normal.    Prenatal labs: ABO, Rh: B/Positive/-- (03/08 1045) Antibody: Negative (03/08 1045) Rubella: 4.84 (03/08 1045) RPR: Non Reactive (05/14 1506)   HBsAg: Negative (03/08 1045)  HIV: Non Reactive (05/14 1506)  GBS:   negative  Assessment/Plan: G3P1011@[redacted]w[redacted]d   Active labor SROM GBS negative  Admit to [facility] Expectant management May have epidural Anticipate NSVD   Sharen Counter 06/04/2018, 6:50 AM

## 2018-06-05 NOTE — Progress Notes (Signed)
Post Partum Day 1 Subjective: no complaints, up ad lib, voiding, tolerating PO and + flatus  Objective: Blood pressure 104/71, pulse 70, temperature 98.1 F (36.7 C), temperature source Oral, resp. rate 20, height 5\' 1"  (1.549 m), weight 80.3 kg, last menstrual period 09/09/2017, SpO2 96 %, unknown if currently breastfeeding.  Physical Exam:  General: alert, cooperative, appears stated age and no distress Lochia: appropriate Uterine Fundus: firm Incision: well-approximated DVT Evaluation: No evidence of DVT seen on physical exam.  Recent Labs    06/04/18 0708  HGB 11.0*  HCT 32.8*    Assessment/Plan: Plan for discharge tomorrow  Patient will need 1 week f/u for 4th degree incision check   LOS: 1 day   Hayley Wolfe, CNM 06/05/2018, 8:57 AM

## 2018-06-05 NOTE — Progress Notes (Signed)
Hayley Wolfe, interpreter, used for admission, reassessment, initial lactation, newborn assessment, and to order dinner and next morning's breakfast.

## 2018-06-05 NOTE — Anesthesia Postprocedure Evaluation (Signed)
Anesthesia Post Note  Patient: Hayley Wolfe  Procedure(s) Performed: AN AD HOC LABOR EPIDURAL  Patient location during evaluation: Mother Baby Anesthesia Type: Epidural Level of consciousness: awake and alert and patient cooperative Pain management: satisfactory to patient Vital Signs Assessment: post-procedure vital signs reviewed and stable Respiratory status: spontaneous breathing Cardiovascular status: stable Postop Assessment: able to ambulate Anesthetic complications: no     Last Vitals:  Vitals:   06/05/18 0529 06/05/18 0813  BP: 94/62 104/71  Pulse: 72 70  Resp: 18 20  Temp: 36.6 C 36.7 C  SpO2: 96%     Last Pain:  Vitals:   06/05/18 1200  TempSrc:   PainSc: 2                  Phillips Goulette

## 2018-06-05 NOTE — Lactation Note (Signed)
This note was copied from a baby's chart. Lactation Consultation Note  Patient Name: Hayley Wolfe IONGE'XToday's Date: 06/05/2018 Reason for consult: Follow-up assessment;Difficult latch;Term  P2 mother whose infant is now 4625 hours old.  RN request for assistance due to baby not feeding well and over 24 hours old and increased jaundice.  Hospital Spanish interpreter used for translation.  Mother had just finished breastfeeding with a #20 NS when I entered.  Mother is now going to begin supplementation with [name].  RN completed discussion and teaching with family.  I assisted with supplementation.  Mother was presented with her feeding options.  She originally wanted to use the artificial nipple for supplementation.  I suggested using the curved tip syringe like the RN had started with her colostrum and explained the reasons why the syringe would be preferable.  Mother agreed after discussion.  Demonstrated how to prepare formula, pull up volume in syringe and feed baby with the curved tip syringe.  Baby fed very well with no difficulty and burped well afterwards.  Encouraged parents to try together after the next breastfeeding session and to call RN/LC if assistance was needed.  Parents verbalized understanding.    Reminded mother to breastfeed first and continue to do hand expression before/after feedings.  She will also continue to pump using the DEBP and we will supplement with any of mother's EBM that she may obtain.  Infant content after feeding and parents satisfied with feeding plan.  RN updated.   Maternal Data Formula Feeding for Exclusion: No Has patient been taught Hand Expression?: Yes  Feeding Feeding Type: Breast Fed Length of feed: 5 min  LATCH Score                   Interventions    Lactation Tools Discussed/Used Tools: Nipple Shields Nipple shield size: 20   Consult Status Consult Status: Follow-up Date: 06/06/18 Follow-up type:  In-patient    Hayley Wolfe 06/05/2018, 7:09 PM

## 2018-06-05 NOTE — Progress Notes (Signed)
MOB was referred for history of depression/anxiety. * Referral screened out by Clinical Social Worker because none of the following criteria appear to apply: ~ History of anxiety/depression during this pregnancy, or of post-partum depression following prior delivery. ~ Diagnosis of anxiety and/or depression within last 3 years OR * MOB's symptoms currently being treated with medication and/or therapy. MOB anxiety/depression was situational due to a prior miscarriage.  No concerns noted in OB record.  Please contact the Clinical Social Worker if needs arise, by [facility] request, or if MOB scores greater than 9/yes to question 10 on Edinburgh Postpartum Depression Screen.  Blaine HamperAngel Boyd-Gilyard, MSW, LCSW Clinical Social Work [phone]

## 2018-06-06 ENCOUNTER — Telehealth: Payer: Self-pay | Admitting: General Practice

## 2018-06-06 ENCOUNTER — Ambulatory Visit: Payer: Self-pay

## 2018-06-06 DIAGNOSIS — Z8759 Personal history of other complications of pregnancy, childbirth and the puerperium: Secondary | ICD-10-CM

## 2018-06-06 LAB — RPR: RPR: NONREACTIVE

## 2018-06-06 LAB — BIRTH TISSUE RECOVERY COLLECTION (PLACENTA DONATION)

## 2018-06-06 MED ORDER — OXYCODONE HCL 5 MG PO TABS: 5.0000 mg | ORAL_TABLET | ORAL | 0 refills | Status: DC | PRN

## 2018-06-06 MED ORDER — POLYETHYLENE GLYCOL 3350 17 G PO PACK: 17.0000 g | PACK | Freq: Two times a day (BID) | ORAL | 0 refills | Status: DC

## 2018-06-06 MED ORDER — DOCUSATE SODIUM 100 MG PO CAPS: 100.0000 mg | ORAL_CAPSULE | Freq: Two times a day (BID) | ORAL | 1 refills | Status: DC

## 2018-06-06 MED ORDER — IBUPROFEN 600 MG PO TABS: 600.0000 mg | ORAL_TABLET | Freq: Four times a day (QID) | ORAL | 0 refills | Status: DC

## 2018-06-06 MED ORDER — POLYETHYLENE GLYCOL 3350 17 G PO PACK: 17.0000 g | PACK | Freq: Every day | ORAL | 0 refills | Status: DC | PRN

## 2018-06-06 NOTE — Lactation Note (Signed)
This note was copied from a baby's chart. Lactation Consultation Note Baby 3553 hrs old stratus interpreter used [phone]#[id number] Larene PickettFabian Mom engorged. Breast hard nipples flat. Unable to hand express breast so hard.  Mom had ice packs on at times. Coconut oil applied to breast for massage. DEBP started. LC massaged breast . Reverse pressure helpful. Mom laid back w/cloth over breast, ice bags. Instructed to rotate ice around breast for 20 min. Lay flat up to 1 hr. Then would try to relive breast again. Mom is formula feeding until can give all BM. Mom is giving BM first. Mom states understanding.   Patient Name: Hayley Providence CrosbyManuela Jarquin-Rios QMVHQ'IToday's Date: 06/06/2018 Reason for consult: Follow-up assessment   Maternal Data    Feeding Feeding Type: Breast Fed  LATCH Score          Comfort (Breast/Nipple): Engorged, cracked, bleeding, large blisters, severe discomfort        Interventions Interventions: DEBP;Ice;Breast compression;Reverse pressure;Coconut oil;Breast massage  Lactation Tools Discussed/Used     Consult Status Consult Status: Follow-up Date: 06/07/18 Follow-up type: In-patient    Charyl DancerCARVER, Danahi Reddish G 06/06/2018, 11:19 PM

## 2018-06-06 NOTE — Addendum Note (Signed)
Addendum  created 06/06/18 1151 by Franco NonesYates, Zan Orlick S, CRNA   Charge Capture section accepted

## 2018-06-06 NOTE — Telephone Encounter (Signed)
Left message on VM for patient to give our office a call back in regards to appt scheduled for 06/16/18 at 10:15am.

## 2018-06-06 NOTE — Discharge Instructions (Signed)
Instrucciones para la mam sobre los cuidados en el hogar (Home Care Instructions for Mom) ACTIVIDAD  Reanude sus actividades regulares de forma gradual.  Descanse. Tome siestas cuando el beb duerme.  No levante objetos que pesen ms de 10libras (4,5kg) hasta que el mdico se lo autorice.  Evite las actividades que demandan mucho esfuerzo y Teacher, early years/pre (que son extenuantes) hasta que el mdico se lo autorice. Caminar a un ritmo tranquilo a moderado siempre es ms seguro.  Si tuvo un parto por cesrea: ? No pase la aspiradora, suba escaleras o conduzca un vehculo durante 4 o 6 semanas. ? Pdale a alguien que le brinde ayuda con las tareas [employer] que pueda realizarlas por su cuenta. ? Haga ejercicios como se lo haya indicado el mdico, si corresponde.  HEMORRAGIA VAGINAL Probablemente contine sangrando durante 4 o 6 semanas despus del parto. Generalmente, la cantidad de sangre disminuye y el color se hace ms claro con el transcurso del Aspers. Sin embargo, si usted est Allied Waste Industries, el color de la sangre puede ser rojo brillante. Si necesita cambiarse la compresa higinica en menos de una hora o tiene cogulos grandes:  Permanezca acostada.  Eleve los pies.  Coloque compresas fras en la zona inferior del abdomen.  Haga reposo.  Comunquese con su mdico. Si est amamantando, podra volver a tener su perodo Le[employer] 8 semanas despus del parto y el momento en que deje de Economist. Si no est amamantando, volver a tener su perodo 6 u 8 semanas despus del [place]. CUIDADOS PERINEALES La zona perineal o perineo, es la parte del cuerpo que se encuentra entre los muslos. Despus del parto, esta zona necesita un cuidado especial. [place] siguientes indicaciones como se lo haya indicado su mdico.  Tome baos de inmersin durante 15 o 20 minutos.  Utilice apsitos o [employer] Mutual [employer] Omaha y Nature conservation officer se lo hayan indicado.  No utilice tampones ni se haga duchas  vaginales hasta que el sangrado vaginal se haya detenido.  Cada vez que vaya al bao: ? Use una botella perineal. ? Cmbiese el apsito. ? Use papel tis en lugar de papel higinico hasta que se cure la sutura.  Haga ejercicios de [employer]. Los ejercicios Kegel ayudan a Theatre manager los msculos que sostienen la vagina, la vejiga y los intestinos. Estos ejercicios se pueden Regulatory affairs officer est parada, sentada o acostada. Para hacer los ejercicios de Kegel: ? Tense los msculos del estmago y los que rodean el canal de [place]. ? Mantenga esta posicin durante unos segundos. ? Reljese. ? Repita hasta hacerlos 5 veces seguidas.  Para evitar las hemorroides o que estas empeoren: ? Beba suficiente lquido para mantener la orina clara o de color amarillo plido. ? Evite hacer fuerza al defecar. ? Tome los Dynegy y laxantes de venta libre como se lo haya indicado el mdico. CUIDADO DE LAS MAMAS  Use un buen sostn.  Evite tomar analgsicos de venta libre para las [employer]ra Foods.  Aplique hielo en los pechos para aliviar las molestias tanto como sea necesario: ? Field seismologist hielo en una bolsa plstica. ? Coloque una Gen[employer]ine Parts piel y la bolsa de hielo. ? Aplique el hielo durante 20, o como se lo haya indicado el mdico.  NUTRICIN  Mantenga una dieta bien balanceada.  No intente perder de peso rpidamente reduciendo el consumo de caloras.  Tome sus vitaminas prenatales hasta el control de postparto o hasta que su mdico se lo indique.  DEPRESIN  POSTPARTO Puede sentir deseos de llorar sin motivo aparente y verse incapaz de enfrentarse a todos los cambios que implica tener un beb. Este estado de nimo se llama depresin postparto. La depresin postparto ocurre porque sus niveles hormonales sufren cambios despus del [place]. Si usted tiene depresin postparto, busque contencin por parte de su pareja, sus amigos y su familia. Si la depresin no desaparece por  s sola despus de algunas semanas, concurra a su mdico. AUTOEXAMEN DE MAMAS Realcese autoexmenes en el mismo momento cada mes. Si est amamantando, el mejor momento de controlar sus mamas es despus de Research scientist (life sciences) al beb, cuando los pechos no estn tan llenos. Si est amamantando y su perodo ya comenz, controle sus mamas el da 5, 6 o 7 de su perodo. Informe a su mdico de cualquier protuberancia, bulto o secrecin. Si est amamantando, las [employer] Corporation. Esto es transitorio y no es un riesgo para Technical sales engineer. INTIMIDAD Y SEXUALIDAD Debe evitar las relaciones sexuales durante al menos 3 o 4 semanas despus del parto o hasta que el flujo de color rojo amarronado haya desaparecido completamente. Si no desea quedar embarazada nuevamente, use algn mtodo anticonceptivo. Despus del parto, puede quedar embarazada incluso si no ha tenido todava el perodo. SOLICITE ATENCIN MDICA SI:  Se siente incapaz de controlar los cambios que implica tener un hijo y esos sentimientos no desaparecen despus de Corporate treasurer.  Detecta una protuberancia, bulto o secrecin en sus mamas.  SOLICITE ATENCIN MDICA DE INMEDIATO SI:  Debe cambiarse la compresa higinica en 1 hora o menos.  Tiene los siguientes sntomas: ? Dolor intenso o calambres en la parte inferior del abdomen. ? Una secrecin vaginal con mal olor. ? Fiebre que no se Target Corporation. ? Una zona de la mama se pone roja y Associate Professor, y adems usted tiene fiebre. ? Una pantorrilla enrojecida y con dolor. ? Repentino e intenso dolor en el pecho. ? Falta de aire. ? Miccin dolorosa o con sangre. ? Problemas visuales.  Vmitos durante 12 horas o ms.  Dolor de cabeza intenso.  Tiene pensamientos serios acerca de lastimarse a usted misma o daar al nio o a otra persona.  Esta informacin no tiene Marine scientist el consejo del mdico. Asegrese de hacerle al mdico cualquier pregunta que  tenga. Document Released: 10/11/2005 Document Revised: 02/02/2016 Document Reviewed: 04/14/2015 Elsevier Interactive Patient Education  2017 [place] de un desgarro perineal (Care of a Perineal Tear) Un desgarro perineal es un corte (laceracin) del tejido que se encuentra entre la abertura de la vagina y el ano (perineo). En algunas mujeres, el desgarro perineal se produce naturalmente durante un parto vaginal. Esto puede ocurrir cuando el beb sale por el canal de [place], y el perineo se estira. Los desgarros perineales se clasifican en funcin de su profundidad y de la longitud de Passenger transport manager. La clasificacin de los desgarros perineales es la siguiente:  Neurosurgeon. Un desgarro superficial en el borde de la abertura de la vagina que se extiende levemente a la piel del perineo.  Terex Corporation. Un desgarro perineal descrito como de primer grado y, adems, uno ms profundo de la abertura vaginal y los tejidos del perineo. Tambin puede Public relations account executive de un msculo justo por debajo de la piel del perineo.  Tercer Alan Ripper. Un desgarro perineal descrito como de primero y PepsiCo que se extiende hasta el msculo del ano (esfnter anal).  [employer]Owners [employer]. OGE Energy de Tree surgeon  perineal descritos como de primero, segundo y tercer LandAmerica Financial se extiende Social worker. Los desgarros perineales de primer grado pueden suturarse o no, dependiendo de su ubicacin y aspecto. Los de [place], tercero y cuarto grado se suturan inmediatamente despus del parto. RIESGOS Y COMPLICACIONES En funcin del tipo de desgarro perineal que tenga, puede correr riesgo de tener lo siguiente:  Hemorragia.  Una acumulacin de sangre en la zona del desgarro perineal (hematoma).  Dolor. Puede sentir dolor al [place] o al defecar.  Infeccin en el lugar del desgarro.  Cristy Hilts.  Dificultad para controlar los intestinos (incontinencia fecal).  Relaciones sexuales dolorosas. CMO CUIDAR UN  [facility] PERINEAL  Engineer, manufacturing systems, ponga hielo en la zona del desgarro. ? Ponga el hielo en una bolsa plstica. ? Coloque una Genuine Parts piel y la bolsa de hielo. ? Coloque el hielo durante 20 minutos, 2 a 3 veces por da.  Tome baos de asiento con agua tibia segn las indicaciones del mdico. Esto puede acelerar la curacin. Puede tomar los baos de asiento en la baera o usar un kit para baos de asiento que se coloca sobre el inodoro. ? Llene la baera con 3 o 4pulgadas (7,6 a 10cm) de agua tibia o llene el recipiente para baos de asiento sobre el inodoro con agua tibia. Para comprobar que el agua no est muy caliente, pngase una gota en la [place]. ? Sintese en el agua tibia durante 20 a 54mnutos. ? Despus del bao, use una toalla limpia para secar el perineo con golpecitos suaves. No se frote el perineo ya que esto podra provocarle dolor, irritacin o abrir las suturas. ? Para mantener el recipiente para los baos de asiento limpio enjuguelo bien despus de cada uso. Para mantener limpia la baera pida ayuda y lvela con lavandina diluida y agua (2cucharadas [377m de lavandina cada medio galn [1,9l] de agua). ? Repita el bao de asiento con la frecuencia que desee para alBest boyla picazn o las molestias perineales.  Aplique un anestsico en aerosol en elTEFL teacherel desgarro perineal de acuerdo con las indicaciones del mdico. Esto ayuda a caActoras moLowpoint Lvese las manos antes y despus de aplicar medicamentos en la zona.  Sobre el apsito sanitario, coloque unas 3 compresas para tratamiento de las hemorroides que contengan agua de hamamelis. El agua de hamamelis ayuda a alRunner, broadcasting/film/video la hinchazn.  Consiga una botella de plstico flexible para enjuagar el perineo con agua tibia cuando orine y roce la zona de adSystems developertrs. Seque la zona dando golpecitos.  Sentarse sobre un aro o una almohada inflable puede ser cmodo.  Tome los  medicamentos solamente como se lo haya indicado el mdico.  No tenga relaciones sexuales ni use tampones hasta que el mdico lo autorice. Generalmente, deber esperar al menos 6semanas.  Concurra a todos los controles del posparto como se lo haya indicado el mdico.  SOLICITE ATENCIN MDICA SI:  El dolor no se alivia con los medicamentos.  Siente dolor al orContinental Airlines Tiene fiebre.  SOHarveyE INMEDIATO SI:  Tiene enrojecimiento o hinchazn, o aumenta el dolor en la zona del deTree surgeon Tiene secrecin de pus de la zona del deTree surgeon Advierte un olor ftido que proviene de la zona del deTree surgeon El desgarro se abre.  Advierte mayor hinchazn en la zona del desgarro en comparacin a cuando sali del hospital.  No puede orinar.  Esta informacin no tiene como fin  reemplazar el consejo del mdico. Asegrese de hacerle al mdico cualquier pregunta que tenga. Document Released: 02/25/2014 Document Revised: 11/01/2014 Document Reviewed: 07/17/2013 Elsevier Interactive Patient Education  2017 [employer].

## 2018-06-06 NOTE — Progress Notes (Signed)
   HPI 37 year old female who presents for prenatal visit. She is a G2P1001 who is currently 39.1 (via 16 week ultrasound). She has been having no new issues since she was last seen on 8/9. She reports feeling some nausea, her feet swelling, mild headache. She had pre-e labs done at last visit which all came back essentially normal. She is feeling no contractions at this visit. She is feeling baby move quite a bit.  Prenatal course, history, notes, ultrasounds, and laboratory results reviewed. Spanish interpreter utilized during today's visit.   Post-Partum Plans - feeding- breast - circ: n/a - contraception- IUD - Pediatrician [facility]  CC: Pre-Natal visit  ROS:   Review of Systems See HPI for ROS.   CC, SH/smoking status, and VS noted  Objective: LMP 09/09/2017  Gen: NAD, alert, cooperative, and pleasant. Well appearing latino female resting comfortably HEENT: NCAT, EOMI, PERRL CV: RRR, no murmur Resp: CTAB, no wheezes, non-labored Abd: SNTND, BS present, no guarding or organomegaly Ext: No edema, warm Neuro: Alert and oriented, Speech clear, No gross deficits BLE: 2+ pitting edema noted   Assessment and plan: Pregnancy issues include:  1. History of anxiety/depression - mood good per patient. No SI or HI.  2. Low lying placenta, resolved on subsequent imaging  3. Feet swelling - Pre-E lab workup all negative. Swelling and headache stable.  4. Routine care - vertex presentation confirmed via limited bedside ultrasound - GBS result reviewed, negative - postpartum plans reviewed as above. - follow up on 8/12 for prenatal visit if not already in the hospital for labor. Will arrange for induction on 8/13 as she will be at 40 weeks. - labor & fetal movement precautions reviewed.     No orders of the defined types were placed in this encounter.   No orders of the defined types were placed in this encounter.    Myrene BuddyJacob Jowan Skillin MD PGY-2 Family  Medicine Resident  06/06/2018 9:03 AM

## 2018-06-06 NOTE — Discharge Summary (Addendum)
OB Discharge Summary     Patient Name: Hayley CrosbyManuela Jarquin-Rios DOB: [birth year] MRN: [id number]  Date of admission: 06/04/2018 Delivering MD: Wyvonnia DuskyLAWSON, MARIE D   Date of discharge: 06/06/2018  Admitting diagnosis: 39wks ctxs Intrauterine pregnancy: 5428w3d     Secondary diagnosis:  Active Problems:   Pregnancy   Normal labor   Fourth degree laceration of perineum during delivery, postpartum   Status post vacuum-assisted vaginal delivery   Shoulder dystocia during labor and delivery, delivered  Additional problems: none      Discharge diagnosis: Term Pregnancy Delivered                                                                                                Post partum procedures:n/a  Augmentation: Pitocin  Complications: None  Hospital course:  Onset of Labor With Vaginal Delivery     37 y.o. yo [id number] at 5328w3d was admitted in Active Labor on 06/04/2018. Patient had a labor course remarkable for becoming complete and requiring vac assistance. Shoulder dystocia lasting 1 min as well as 4th deg lac- see Delivery Summary.  Membrane Rupture Time/Date: 2:00 AM ,06/04/2018   Intrapartum Procedures: Episiotomy: None [1]                                         Lacerations:  4th degree [5]  Patient had a delivery of a Viable infant. 06/04/2018  Information for the patient's newborn:  Jerrye BushyJarquin-Rios, Girl Anitta [[id number]][[id number]]  Delivery Method: Vaginal, Vacuum (Extractor)(Filed from Delivery Summary)    Pateint had an uncomplicated postpartum course.  She is ambulating, tolerating a regular diet, passing flatus, and urinating well. Patient is discharged home in stable condition on 06/06/18.   Physical exam  Vitals:   06/05/18 0813 06/05/18 1434 06/05/18 2326 06/06/18 0526  BP: 104/71 99/65 109/69 114/76  Pulse: 70 79 72 97  Resp: 20 18 16 16   Temp: 98.1 F (36.7 C) 98.2 F (36.8 C) 98.7 F (37.1 C) 98 F (36.7 C)  TempSrc: Oral Oral Oral Oral  SpO2:  100% 99%   Weight:       Height:       General: alert, cooperative and no distress Lochia: appropriate Uterine Fundus: firm Incision: N/A Perineum: edges approximated, no s/s infection DVT Evaluation: No evidence of DVT seen on physical exam. Labs: Lab Results  Component Value Date   WBC 8.8 06/04/2018   HGB 11.0 (L) 06/04/2018   HCT 32.8 (L) 06/04/2018   MCV 86.1 06/04/2018   PLT 174 06/04/2018   CMP Latest Ref Rng & Units 05/25/2018  Glucose 65 - 99 mg/dL 94  BUN 6 - 20 mg/dL 11  Creatinine 1.300.57 - 8.651.00 mg/dL 7.840.63  Sodium 696134 - 295144 mmol/L 140  Potassium 3.5 - 5.2 mmol/L 4.2  Chloride 96 - 106 mmol/L 106  CO2 20 - 29 mmol/L 20  Calcium 8.7 - 10.2 mg/dL 8.7  Total Protein 6.0 - 8.5 g/dL 6.0  Total Bilirubin 0.0 - 1.2 mg/dL 0.5  Alkaline  Phos 39 - 117 IU/L 110  AST 0 - 40 IU/L 18  ALT 0 - 32 IU/L 14    Discharge instruction: per After Visit Summary and "Baby and Me Booklet".  After visit meds:  Allergies as of 06/06/2018   No Known Allergies     Medication List    TAKE these medications   docusate sodium 100 MG capsule Commonly known as:  COLACE Take 1 capsule (100 mg total) by mouth 2 (two) times daily.   ibuprofen 600 MG tablet Commonly known as:  ADVIL,MOTRIN Take 1 tablet (600 mg total) by mouth every 6 (six) hours.   oxyCODONE 5 MG immediate release tablet Commonly known as:  Oxy IR/ROXICODONE Take 1 tablet (5 mg total) by mouth every 4 (four) hours as needed (pain scale 4-7).   polyethylene glycol packet Commonly known as:  MIRALAX / GLYCOLAX Take 17 g by mouth daily as needed.   prenatal multivitamin Tabs tablet Take 1 tablet by mouth daily at 12 noon.       Diet: routine diet  Activity: Advance as tolerated. Pelvic rest for 6 weeks.   [facility]patient follow up[facility]ineal tear. [facility]  Follow up Appt: Future Appointments  Date Time Provider Department Center  06/07/2018 10:50 AM Tillman Sersiccio, Angela C, DO  [facility]   Follow up Visit:No follow-ups on file.  Postpartum contraception: IUD    Newborn Data: Live born female  Birth Weight: 8 lb 4.6 oz (3759 g) APGAR: 8, 8  Newborn Delivery   Time head delivered:  06/04/2018 17:56:00 Birth date/time:  06/04/2018 17:57:00 Delivery type:  Vaginal, Vacuum (Extractor)     Baby Feeding: Breast Disposition:home with mother   06/06/2018 Sandre Kittyaniel K Olson, MD  CNM attestation I have seen and examined this patient and agree with above documentation in the resident's note.   Hayley CrosbyManuela Jarquin-Rios is a 37 y.o. [id number] s/p VAVD with 4th degree laceration.   Pain is well controlled.  Plan for birth control is IUD.  Method of Feeding: breast  PE:  BP 114/76   Pulse 97   Temp 98 F (36.7 C) (Oral)   Resp 16   Ht 5\' 1"  (1.549 m)   Wt 80.3 kg   LMP 09/09/2017   SpO2 99%   Breastfeeding? Unknown   BMI 33.44 kg/m  Fundus firm  Recent Labs    06/04/18 0708  HGB 11.0*  HCT 32.8*     Plan: discharge today - postpartum care discussed including bowel regimen - f/u [facility] in 1wk for lac eval; [facility]   Monty Mccarrell, Cala BradfordKIMBERLY, CNM 2:05 PM  06/06/2018

## 2018-06-07 ENCOUNTER — Ambulatory Visit: Payer: Self-pay

## 2018-06-07 ENCOUNTER — Encounter: Payer: Self-pay | Admitting: Family Medicine

## 2018-06-07 NOTE — Lactation Note (Signed)
This note was copied from a baby's chart. Lactation Consultation Note  Patient Name: Hayley Wolfe ZOXWR'UToday's Date: 06/07/2018 Reason for consult: Follow-up assessment;Engorgement;Other (Comment)(borderline engorgement - working on it - see LC note ) I- pad Spanish interpreter Rella Larve- Emmanuel [phone]  Interpreting. 1st visit  LC visited this mom several times prior to D/C due to engorgement , brought to the attention  From the D/C RN. 1st visit.  RN and LC provided 3 packs of re- useable ice packs - and had mom lie on her back .  Ice on the top and on the sides, and mom iced for 30 mins due to breast being firm and hard, and took a nap.   2nd LC visit - [facility] Spanish interpreter ) present for visit.  LC discussed with mom was the  [facility] would be recommended to decrease  Her engorgement and get her breast somewhat back to normal prior to D/C.  Per mom experienced this with her 1st baby and ended up bottle feeding.  With moms permission applied warm coconut oil to both breast with a backward massage.  Alternating both breast and pumping the other one 1st with hand pump . With very little results.  LC then sat mom up and had mom hand express into a bottle and she was able to get off 10 ml.  LC proceeded to have mom pump both breast with DEBP - started with #27 Flange and it was to tight , increased to #30 with total EBM yield 10 ml total from both breast.  Dad fed back to baby.  LC suspects mom may not be relaxing in the hospital to let down mink well.   3rd visit - [place] interpreter Jonetta Speak- Luis  [phone]- #[phone]  [facility] to [facility] and dad the [facility] plan for [facility]/C -  [facility] and does not have a DEBP at home.  Per mom it seems I get more off hand expressing compared to pumping.  Per mom has only pumped x 2 in the last 24 hours.  LC reminded  Mom how engorged she is and the breast swelling needs to  Come down so the milk will flow better.  LC recommended  Going home and  working on the engorgement tx  Buy cabbage, place in the refrigerator, when cold , rinse with cold water,  Row together to activate, and apply 2 large leaves each breast and the ice packs on  Top and sides for 15 -20 mins, after that tx pump alternating both sides.  Per mom feels she will go home and try the hand pump 1st and  If problems will come Back to rent a DEBP from the gift shop.  LC reminded mom and dad to take all pump pieces home and hand pump .  Per mom not active with WIC .   Mom aware she can call back , also of the [facility]. Made through the clinic and Tuesday support group in education at 11 am.      Maternal Data Has patient been taught Hand Expression?: Yes  Feeding    LATCH Score                   Interventions Interventions: Breast feeding basics reviewed  Lactation Tools Discussed/Used Tools: Flanges Flange Size: 30;Other (comment)(most EBM yield - 10 EBM / the #30 F fit the best ) Breast pump type: Double-Electric Breast Pump Pump Review: Setup, frequency, and  cleaning;Milk Storage   Consult Status Consult Status: Complete(mom aware [facility] if boarder line engorgement not resolved at home ) Date: 06/07/18 Follow-up type: In-patient    Hayley SprangMargaret Ann Olivene Wolfe 06/07/2018, 4:44 PM

## 2018-06-16 ENCOUNTER — Encounter: Payer: Self-pay | Admitting: Obstetrics & Gynecology

## 2018-06-16 ENCOUNTER — Ambulatory Visit: Payer: Self-pay | Admitting: Obstetrics & Gynecology

## 2018-06-16 NOTE — Progress Notes (Signed)
   Subjective:    Patient ID: Hayley Wolfe, female    DOB: 1981/02/06, 37 y.o.   MRN: [id number]  HPI 37 yo P2 here for a check of her 4th degree repair, done after a VAVD with shoulder dystocia on 06-04-18.    Review of Systems Pap smear was normal 3/19.    Objective:   Physical Exam Breathing, conversing, and ambulating normally Well nourished, well hydrated Latina, no apparent distress  Vulva- healed well    Assessment & Plan:  4 th degree tear, repaired

## 2018-07-26 ENCOUNTER — Encounter: Payer: Self-pay | Admitting: Student in an Organized Health Care Education/Training Program

## 2018-07-26 ENCOUNTER — Other Ambulatory Visit: Payer: Self-pay

## 2018-07-26 ENCOUNTER — Ambulatory Visit (INDEPENDENT_AMBULATORY_CARE_PROVIDER_SITE_OTHER): Payer: Self-pay | Admitting: Student in an Organized Health Care Education/Training Program

## 2018-07-26 VITALS — BP 96/60 | HR 88 | Temp 98.7°F | Ht 62.0 in | Wt 153.6 lb

## 2018-07-26 DIAGNOSIS — N6459 Other signs and symptoms in breast: Secondary | ICD-10-CM

## 2018-07-26 NOTE — Patient Instructions (Signed)
It was a pleasure seeing you today in our clinic.   Our clinic's number is [phone]. Please call with questions or concerns about what we discussed today.  Be well, Dr. Braelynn Lupton   

## 2018-07-26 NOTE — Progress Notes (Signed)
Subjective:    Hayley Wolfe is a 37 y.o. G66P2012 Hispanic female who presents for a postpartum visit. She is 7 weeks postpartum following a vacuum-assisted delivery with shoulder dystocia and with a 4th degree laceration. I have fully reviewed the prenatal and intrapartum course. The delivery was at 39 weeks and 3 days. Outcome: spontaneous vaginal delivery. Anesthesia: epidural. Postpartum course has been uneventful. Baby's course has been uneventfull. Baby is feeding by formula and breast-feeding.  Bleeding no bleeding. Bowel function is normal. Bladder function is normal. Patient is not sexually active. Contraception method is none.  Discussed contraceptive options including IUD, Nexplanon, OCPs, however patient declines these options at this time.  Recommended that she use barrier contraception to prevent pregnancy over the next year in the postpartum period for her safety and the safety of a potential pregnancy.  Postpartum depression screening: negative.  Patient reports that she had difficulty breast-feeding because she has an inverted nipple on the left side and the baby has been unable to latch.  She is only been feeding from the right side.  As result the left breast is been becoming engorged with clogged ducts.  She has not had any fevers or warmth to suggest mastitis.  She reports that she is primarily formula feeding but would like further assistance in breast-feeding her baby.  The following portions of the patient's history were reviewed and updated as appropriate: allergies, current medications, past medical history, past surgical history and problem list.  Review of Systems Pertinent items are noted in HPI.   Vitals:   07/26/18 0942  BP: 96/60  Pulse: 88  Temp: 98.7 F (37.1 C)  TempSrc: Oral  SpO2: 98%  Weight: 153 lb 9.6 oz (69.7 kg)  Height: 5\' 2"  (1.575 m)    Objective:     General:  alert, cooperative and no distress   Breasts:  Left nipple inversion. Hard    Lungs: clear to auscultation bilaterally  Heart:  regular rate and rhythm  Abdomen: soft, nontender   Vulva: normal  Vagina: normal vagina  Cervix:  closed  Corpus: Well-involuted  Adnexa:  Non-palpable  Rectal Exam: no hemorrhoids        Assessment:   7 weeks postpartum exam S/p vacuum assisted delivery with shoulder dystocia and 4th degree laceration, healing well  Depression screening Contraception counseling    Plan:   Breast feeding difficulty  secondary to inverted nipple on the left.  Patient reports that this is been her anatomy since birth.  She has been unable to get the baby to latch on the left.  She continues to breast-feed from the right side.  When she pumps she only gets a couple drops out of the left side.  Informed the patient that she will need to work with lactation to help express the milk or breast-feed from the left side.  The only alternative would be to discontinue breast-feeding altogether which is much less preferable to the patient.  If she continues to breast-feed only from the right side she is at risk for discomfort from clogged ducts and potential mastitis. -Ambulatory referral to lactation -Patient is able to get lactation consult services through the health department and [facility], number was provided to her today in the office to schedule an appointment -Provided prescription for a mechanical pump -Call with any questions regarding what we discussed today  Postpartum care patient is overall doing well.  Normal postpartum care.  she is declining contraception.

## 2021-07-21 ENCOUNTER — Other Ambulatory Visit: Payer: Self-pay

## 2021-07-21 DIAGNOSIS — M79622 Pain in left upper arm: Secondary | ICD-10-CM

## 2021-07-21 DIAGNOSIS — N644 Mastodynia: Secondary | ICD-10-CM

## 2021-08-21 ENCOUNTER — Telehealth: Payer: Self-pay | Admitting: Family Medicine

## 2021-08-21 NOTE — Telephone Encounter (Signed)
Copied from CRM [id number]. Topic: General - Other >> Aug 17, 2021 10:47 AM Jaquita Rector A wrote: Reason for CRM: Patient called in to get an appointment with Mikle Bosworth for the [facility].  Please call Ph# [phone]

## 2021-08-21 NOTE — Telephone Encounter (Signed)
I return Pt call, she was inform that she need to be a Pt of the clinic to apply for the financial programs

## 2021-08-25 ENCOUNTER — Ambulatory Visit: Payer: Self-pay

## 2021-08-25 ENCOUNTER — Other Ambulatory Visit: Payer: Self-pay

## 2022-04-13 ENCOUNTER — Ambulatory Visit
Admission: RE | Admit: 2022-04-13 | Discharge: 2022-04-13 | Disposition: A | Discharge status: Home / Self Care | Payer: No Typology Code available for payment source | Source: Ambulatory Visit | Attending: Obstetrics and Gynecology | Admitting: Obstetrics and Gynecology

## 2022-04-13 ENCOUNTER — Ambulatory Visit: Payer: Self-pay

## 2022-04-13 ENCOUNTER — Ambulatory Visit: Payer: Self-pay | Admitting: *Deleted

## 2022-04-13 VITALS — BP 110/72 | Wt 170.4 lb

## 2022-04-13 DIAGNOSIS — N644 Mastodynia: Secondary | ICD-10-CM

## 2022-04-13 DIAGNOSIS — M79622 Pain in left upper arm: Secondary | ICD-10-CM

## 2022-04-13 DIAGNOSIS — Z1239 Encounter for other screening for malignant neoplasm of breast: Secondary | ICD-10-CM

## 2022-04-13 NOTE — Patient Instructions (Signed)
Explained breast self awareness with [employer]. Patient did not need a Pap smear today due to last Pap smear and HPV typing was 01/06/2018. Let her know BCCCP will cover Pap smears and HPV typing every 5 years unless has a history of abnormal Pap smears. Referred patient to the [facility] for a diagnostic mammogram. Appointment scheduled Tuesday, April 13, 2022 at 1240. Patient aware of appointment and will be there. Hayley Wolfe verbalized understanding.  Ozil Stettler, Kathaleen Maser, RN 11:46 AM

## 2022-04-13 NOTE — Progress Notes (Signed)
[name] is a 41 y.o. female who presents to [facility] clinic today with complaint of left inner breast pain that radiates to the entire breast x 7 months that comes and goes. Patient rates the pain at a 8 out of 10. Patient complained of a milky spontaneous left nipple discharge that occurred one time 9-10 months ago.    Pap Smear: Pap smear not completed today. Last Pap smear was 01/06/2018 at [facility] clinic and was normal with negative HPV. Per patient has no history of an abnormal Pap smear. Last Pap smear result is available in Epic.   Physical exam: Breasts Breasts symmetrical. No skin abnormalities bilateral breasts. No nipple retraction right breast. Left nipple slightly inverted that per patient is normal for her. No nipple discharge bilateral breasts. Unable to express any nipple discharge on exam. No lymphadenopathy. No lumps palpated bilateral breasts. Complaints of left lower and inner breast pain on exam.       Pelvic/Bimanual Pap is not indicated today per BCCCP guidelines.   Smoking History: Patient has never smoked.   Patient Navigation: Patient education provided. Access to services provided for patient through [facility] program. Spanish interpreter Natale Lay from [facility] provided.    Breast and Cervical Cancer Risk Assessment: Patient does not have family history of breast cancer, known genetic mutations, or radiation treatment to the chest before age 90. Patient does not have history of cervical dysplasia, immunocompromised, or DES exposure in-utero.  Risk Assessment     Risk Scores       04/13/2022   Last edited by: Meryl Dare, CMA   5-year risk: 0.4 %   Lifetime risk: 7.9 %            A: BCCCP exam without pap smear Complaint of left breast pain and discharge.  P: Referred patient to the [facility] for a diagnostic mammogram. Appointment scheduled Tuesday, April 13, 2022 at 1240.  Priscille Heidelberg, RN 04/13/2022 11:46 AM

## 2022-04-21 ENCOUNTER — Other Ambulatory Visit: Payer: Self-pay

## 2022-04-21 ENCOUNTER — Inpatient Hospital Stay: Payer: Self-pay | Attending: Obstetrics and Gynecology | Admitting: *Deleted

## 2022-04-21 VITALS — BP 102/76 | Ht 61.0 in | Wt 173.5 lb

## 2022-04-21 DIAGNOSIS — Z0000 Encounter for general adult medical examination without abnormal findings: Secondary | ICD-10-CM

## 2022-04-21 NOTE — Progress Notes (Signed)
Wisewoman initial screening   Interpreter- Natale Lay, [employer]   Clinical Measurement: There were no vitals filed for this visit. Fasting Labs Drawn Today, will review with patient when they result.   Medical History:  Patient states that she does not have high cholesterol, does not have high blood pressure and she does not have diabetes.  Medications:  Patient states that she does not take medication to lower cholesterol, blood pressure or blood sugar.  Patient does not take an aspirin a day to help prevent a heart attack or stroke.    Blood pressure, self measurement: Patient states that she does not measure blood pressure from home. She checks her blood pressure N/A. She shares her readings with a health care provider: N/A.   Nutrition: Patient states that on average she eats 2 cups of fruit and 1 cups of vegetables per day. Patient states that she does not eat fish at least 2 times per week. Patient eats about half servings of whole grains. Patient drinks less than 36 ounces of beverages with added sugar weekly: yes. Patient is currently watching sodium or salt intake: yes. In the past 7 days patient has consumed drinks containing alcohol on 0 days. On a day that patient consumes drinks containing alcohol on average 0 drinks are consumed.      Physical activity:  Patient states that she gets 0 minutes of moderate and 0 minutes of vigorous physical activity each week.  Smoking status:  Patient states that she has has never smoked .   Quality of life:  Over the past 2 weeks patient states that she had little interest or pleasure in doing things: not at all. She has been feeling down, depressed or hopeless:not at all.    Risk reduction and counseling:   Health Coaching: Spoke with the patient about the daily recommendation for fruits and vegetables. Showed patient what a serving size looks like. Patient currently consumes fish once a month. Spoke with patient about how heart healthy fish  can benefit cardiovascular health (salmon, tuna, mackerel, sea bass, trout or sardines). Patient consumes whole wheat bread, oatmeal and whole wheat pasta but not regularly. Spoke with patient about benefits of whole grains in regards to cardiovascular health. Spoke with patient about the daily recommendation for daily exercise. Patient states that she does not exercise often. Encouraged patient to try and start taking short walks with her diet. Patient did not state that she wanted to work on exercising. Will continue to encourage patient during health coaching sessions.   Goal:  Patient would like to make changes with her diet. Patient would like to increase the amount of vegetables that she consumes daily. Patient currently consumes 1 serving/ cup of vegetables per day. Patient would like to increase the amount she consumes to 2 servings a day. Patient will work on making this change over the next month.    Navigation:  I will notify patient of lab results.  Patient is aware of 2 more health coaching sessions and a follow up.  Time: 25 minutes

## 2022-04-22 LAB — LIPID PANEL
Chol/HDL Ratio: 3.5 ratio (ref 0.0–4.4)
Cholesterol, Total: 156 mg/dL (ref 100–199)
HDL: 45 mg/dL (ref >39)
LDL Chol Calc (NIH): 94 mg/dL (ref 0–99)
Triglycerides: 93 mg/dL (ref 0–149)
VLDL Cholesterol Cal: 17 mg/dL (ref 5–40)

## 2022-04-22 LAB — HEMOGLOBIN A1C
Est. average glucose Bld gHb Est-mCnc: 120 mg/dL
Hgb A1c MFr Bld: 5.8 % — ABNORMAL HIGH (ref 4.8–5.6)

## 2022-04-22 LAB — GLUCOSE, RANDOM: Glucose: 111 mg/dL — ABNORMAL HIGH (ref 70–99)

## 2022-04-28 ENCOUNTER — Telehealth: Payer: Self-pay

## 2022-04-28 NOTE — Telephone Encounter (Signed)
Health coaching 2   interpreter- [employer] [phone]   Labs-156 cholesterol, 94 LDL cholesterol, 93 triglycerides, 45 HDL cholesterol, 5.8 hemoglobin A1C, 111 mean plasma glucose. Patient understands and is aware of her lab results.   Goals-  1. Start exercising for at least 20-30 minutes daily. 2. Cut back on the amount of carbs consumed. Patient states that she enjoys more carb rich foods vs. Sweet and sugary foods and drinks. Spoke with patient about portion control when eating carbs.   Navigation:  Patient is aware of 1 more health coaching sessions and a follow up. Will call patient with follow-up appointment information with Internal Medicine once appointment is scheduled. Offered to refer patient to [facility] [place] Spanish [employer] program but patient declined referral at this time.   Time-  15 minutes

## 2022-05-06 ENCOUNTER — Ambulatory Visit (INDEPENDENT_AMBULATORY_CARE_PROVIDER_SITE_OTHER): Payer: Self-pay | Admitting: Student

## 2022-05-06 ENCOUNTER — Encounter: Payer: Self-pay | Admitting: Student

## 2022-05-06 DIAGNOSIS — R7303 Prediabetes: Secondary | ICD-10-CM | POA: Insufficient documentation

## 2022-05-06 NOTE — Progress Notes (Signed)
CC: Elevated A1c  HPI:  [name] is a 41 y.o. female living with a history stated below and presents today for a follow up for her slightly elevated A1c. Please see problem based assessment and plan for additional details.  Past Medical History:  Diagnosis Date   Medical history non-contributory     Current Outpatient Medications on File Prior to Visit  Medication Sig Dispense Refill   docusate sodium (COLACE) 100 MG capsule Take 1 capsule (100 mg total) by mouth 2 (two) times daily. (Patient not taking: Reported on 04/13/2022) 60 capsule 1   ibuprofen (ADVIL,MOTRIN) 600 MG tablet Take 1 tablet (600 mg total) by mouth every 6 (six) hours. (Patient not taking: Reported on 04/13/2022) 30 tablet 0   oxyCODONE (OXY IR/ROXICODONE) 5 MG immediate release tablet Take 1 tablet (5 mg total) by mouth every 4 (four) hours as needed (pain scale 4-7). (Patient not taking: Reported on 06/16/2018) 15 tablet 0   polyethylene glycol (MIRALAX / GLYCOLAX) packet Take 17 g by mouth daily as needed. (Patient not taking: Reported on 04/13/2022) 14 each 0   Prenatal Vit-Fe Fumarate-FA (PRENATAL MULTIVITAMIN) TABS tablet Take 1 tablet by mouth daily at 12 noon. (Patient not taking: Reported on 04/13/2022)     No current facility-administered medications on file prior to visit.    Family History  Problem Relation Age of Onset   BRCA 1/2 Neg Hx    Breast cancer Neg Hx     Social History   Socioeconomic History   Marital status: Single    Spouse name: Rey   Number of children: Not on file   Years of education: Not on file   Highest education level: 9th grade  Occupational History   Not on file  Tobacco Use   Smoking status: Never   Smokeless tobacco: Never  Substance and Sexual Activity   Alcohol use: No   Drug use: No   Sexual activity: Yes    Birth control/protection: Condom  Other Topics Concern   Not on file  Social History Narrative   Not on file   Social Determinants of  Health   Financial Resource Strain: Not on file  Food Insecurity: No Food Insecurity (04/21/2022)   Hunger Vital Sign    Worried About Running Out of Food in the Last Year: Never true    Ran Out of Food in the Last Year: Never true  Transportation Needs: No Transportation Needs (04/21/2022)   PRAPARE - Hydrologist (Medical): No    Lack of Transportation (Non-Medical): No  Physical Activity: Not on file  Stress: Not on file  Social Connections: Not on file  Intimate Partner Violence: Not on file    Review of Systems: ROS negative except for what is noted on the assessment and plan.  Vitals:   05/06/22 0827  BP: 109/65  Pulse: 75  Temp: 98.1 F (36.7 C)  TempSrc: Oral  SpO2: 98%  Weight: 172 lb 4.8 oz (78.2 kg)  Height: '5\' 1"'  (1.549 m)    Physical Exam: Constitutional: well-appearing woman sitting comfortably, in no acute distress HENT: normocephalic atraumatic, mucous membranes moist Eyes: conjunctiva non-erythematous Cardiovascular: regular rate and rhythm, no m/r/g Pulmonary/Chest: normal work of breathing on room air, lungs clear to auscultation bilaterally MSK: normal bulk and tone, no foot ulcers and distal pulses +2 Skin: warm and dry   Assessment & Plan:   Prediabetes Patient's last A1c was on June 28 with a value  of 5.8.  Her glucose level at that time was 111.  She has no other values to compare to.  Lipid panel was also done on 6/28 which showed normal cholesterol, triglycerides, LDL.  Patient denies any polyuria, polydipsia, and blurry vision. She is also a non-smoker.  Feet were examined today, with no ulcerations and distal pulses were all +2  Plan:  - Given that the only risk factor for diabetes in this patient is a slightly elevated A1c as well as obesity, we are recommending lifestyle modifications for her.  - I advised patient on diet modification, and also encouraged her to incorporate walking into her daily routine  -  We follow up with patient in a year  Patient seen with Dr. Charissa Bash Victoria Euceda, M.D. [facility], PGY-1 Phone: [phone] Date 05/06/2022 Time 9:11 AM

## 2022-05-06 NOTE — Patient Instructions (Signed)
Thank you so much for coming to the clinic today!  1.  We talked about your blood sugar being a little bit high today, however with some modifications to your diet, as well as adding some exercise into your daily routine we should not have anything to worry about.  2.  Would like to see you back in about a year, or if anything comes up sooner please do not hesitate to give the office a call.  Here is the number: [phone]  Dr. Jason Fila Bhakti Labella   Muchas gracias por venir a la clnica hoy!  1. Hablamos de que su nivel de azcar en la sangre es un poco alto hoy, sin embargo, con algunas modificaciones en su dieta, as como la adicin de algo de ejercicio a su rutina diaria, no deberamos tener nada de qu preocuparnos.  2. Me gustara verlo de vuelta en aproximadamente un ao, o si surge algo antes, no dude en llamar a la oficina.  [place]: [phone]  Dr. Jason Fila Dorr Perrot

## 2022-05-06 NOTE — Assessment & Plan Note (Addendum)
Patient's last A1c was on June 28 with a value of 5.8.  Her glucose level at that time was 111.  She has no other values to compare to.  Lipid panel was also done on 6/28 which showed normal cholesterol, triglycerides, LDL.  Patient denies any polyuria, polydipsia, and blurry vision. She is also a non-smoker.  Feet were examined today, with no ulcerations and distal pulses were all +2  Plan:  - Given that the only risk factor for diabetes in this patient is a slightly elevated A1c as well as obesity, we are recommending lifestyle modifications for her.  - I advised patient on diet modification, and also encouraged her to incorporate walking into her daily routine  - We will follow up with patient in a year

## 2022-05-07 NOTE — Progress Notes (Signed)
Internal Medicine Clinic Attending  I saw and evaluated the patient.  I personally confirmed the key portions of the history and exam documented by Dr. Nooruddin and I reviewed pertinent patient test results.  The assessment, diagnosis, and plan were formulated together and I agree with the documentation in the resident's note.  

## 2022-05-07 NOTE — Addendum Note (Signed)
Addended by: Erlinda Hong T on: 05/07/2022 08:59 AM   Modules accepted: Level of Service

## 2022-07-21 ENCOUNTER — Telehealth: Payer: Self-pay

## 2022-07-21 NOTE — Telephone Encounter (Signed)
Left message for patient about completing HC 3 for the [facility] Woman program. Left name and number for patient to call back. 

## 2022-07-22 ENCOUNTER — Telehealth: Payer: Self-pay

## 2022-07-22 NOTE — Telephone Encounter (Signed)
Health Coaching 3  interpreter-  Rudene Anda, [facility]   Goals- Patient states that she has tried to make small changes with diet to help lower her blood sugar. Patient states that she is eating less bread. Patient has also added a serving of vegetables into her daily diet. Patient has been walking 2-3 days a week for 15-30 minutes.    New goal- Patient still wants to work on increasing the amount of exercise that she does each of week. Patient will try to increase exercise to 4-5 days a week for 15-30 minutes.  Barrier to reaching goal-    Strategies to overcome-    Navigation:  Patient is aware of  a follow up session on 09/08/22 @ 11:15 am.    Time-  10 minutes

## 2022-09-08 ENCOUNTER — Inpatient Hospital Stay: Payer: Self-pay | Attending: Obstetrics and Gynecology | Admitting: *Deleted

## 2022-09-08 VITALS — BP 118/78 | Ht 61.0 in | Wt 169.8 lb

## 2022-09-08 DIAGNOSIS — Z0000 Encounter for general adult medical examination without abnormal findings: Secondary | ICD-10-CM

## 2022-09-08 NOTE — Progress Notes (Signed)
Wisewoman follow up   Interpreter: Natale Lay, [facility] Laws  Clinical Measurement:   Vitals:   09/08/22 1135 09/08/22 1154  BP: 118/78 118/78      Medical History:  Patient states that she does not have high cholesterol, does not have high blood pressure and she does not have diabetes.  Medications:  Patient states that she does not take medication to lower cholesterol, blood pressure and blood sugar.  Patient does not take an aspirin a day to help prevent a heart attack or stroke.    Blood pressure, self measurement: Patient states that she does not measure blood pressure from home. She checks her blood pressure N/A. She shares her readings with a health care provider: N/A.   Nutrition: Patient states that on average she eats 2 cups of fruit and 1 cups of vegetables per day. Patient states that she does not eat fish at least 2 times per week. Patient eats about half servings of whole grains. Patient drinks less than 36 ounces of beverages with added sugar weekly: yes. Patient is currently watching sodium or salt intake: yes. In the past 7 days patient has had 0 drinks containing alcohol. On average patient drinks 0 drinks containing alcohol per day.      Physical activity:  Patient states that she gets 30 minutes of moderate and 0 minutes of vigorous physical activity each week.  Smoking status:  Patient states that she has has never smoked .   Quality of life:  Over the past 2 weeks patient states that she had little interest or pleasure in doing things: several days. She has been feeling down, depressed or hopeless:not at all.    Risk reduction and counseling:   Health Coaching: Spoke with patient continuing to watch the amount of sweets and sugars consumed in both food and drinks. Also spoke with patient about watching the amount of carbs consumed. Spoke in detail with patient about portion control and the daily recommendations for the grain food group. Gave patient a portion container to  use to help measure out the amount of carbs that she is consuming. Spoke with patient about cutting back on the amount of tortillas consumed. Patient consumes on average 6 tortillas per day. Encouraged patient if possible to try and decrease by 1/2. Patient has been walking a few times per week. Encouraged patient if possible to try and get 20-30 minutes of daily exercise.    Navigation: This was the  follow up session for this patient, I will check up on her progress in the coming months. Provided patient with shopping experience in the [employer].   Time: 20 minutes

## 2023-04-18 ENCOUNTER — Other Ambulatory Visit: Payer: Self-pay

## 2023-04-18 ENCOUNTER — Inpatient Hospital Stay: Payer: Self-pay | Attending: Obstetrics and Gynecology | Admitting: *Deleted

## 2023-04-18 VITALS — BP 104/70 | Ht 61.0 in | Wt 169.6 lb

## 2023-04-18 DIAGNOSIS — Z1231 Encounter for screening mammogram for malignant neoplasm of breast: Secondary | ICD-10-CM

## 2023-04-18 DIAGNOSIS — Z0000 Encounter for general adult medical examination without abnormal findings: Secondary | ICD-10-CM

## 2023-04-18 NOTE — Progress Notes (Signed)
[facility] initial screening   Interpreter- Natale Lay, [place]   Clinical Measurement:  Vitals:   04/18/23 0940 04/18/23 0958  BP: 102/68 104/70   Fasting Labs Drawn Today, will review with patient when they result.   Medical History: Patient states that she does not have high cholesterol, does not have high blood pressure and she does not have diabetes.  Medications: Patient states that she does not take medication to lower cholesterol, blood pressure or blood sugar.  Patient does not take an aspirin a day to help prevent a heart attack or stroke.    Blood pressure, self measurement: Patient states that she does not measure blood pressure from home. She checks her blood pressure N/A. She shares her readings with a health care provider: N/A.   Nutrition: Patient states that on average she eats 3 cups of fruit and 2 cups of vegetables per day. Patient states that she does not eat fish at least 2 times per week. Patient eats less than half servings of whole grains. Patient drinks less than 36 ounces of beverages with added sugar weekly: yes. Patient is currently watching sodium or salt intake: yes. In the past 7 days patient has consumed drinks containing alcohol on 0 days. On a day that patient consumes drinks containing alcohol on average 0 drinks are consumed.      Physical activity: Patient states that she gets 60 minutes of moderate and 0 minutes of vigorous physical activity each week.  Smoking status: Patient states that she has has never smoked .   Quality of life: Over the past 2 weeks patient states that she had little interest or pleasure in doing things: not at all. She has been feeling down, depressed or hopeless:not at all.   Social Determinants of Health Assessment:   Computer Use: During the last 12 months patient states that she has used any of the following: desktop/laptop, smart phone or tablet/other portable wireless computer: yes.   Internet Use: During the last 12  months, did you or any member of your household have access to the internet: Yes, by paying a cell phone company or internet service provider.   Food Insecurities: During the last 12 months, where there any times when you were worried that you would run out of food because of a lack of money or other resources: No.   Transportation Barriers: During the last 12 months, have you missed a doctor's appointment because of transportation problems: No.   Childcare Barriers: If you are currently using childcare services, please identify  the type of services you use. (If not using childcare services, please select "Not applicable"): not applicable. During the last 12 months, have you had any barriers to childcare services such as: not applicable.   Housing: What is your housing situation today: I have housing.   Intimate Partner Violence: During the last 12 months, how often did your partner physically hurt you: never. During the last 12 months, how often did your partner insult you or talk down to you: never.  Medication Adherence: During the last 12 months, did you ever forget to take your medicine: not applicable. During the last 12 months, were you careless ar times about taking your medicine: not applicable. During the last 12 months, when you felt better did you sometimes stop taking your medication: not applicable. During the last 12 months, sometimes if you felt worse when you took your medicine did you stop taking it: not applicable.   Risk reduction and counseling:  Health Coaching: Spoke with patient abut the daily recommendation for fruits and vegetables. Showed patient what a serving size would look like. Patient consumes fish at least once a week. Gave suggestion to try and add in another serving during the week. Patient consumes whole wheat bread but not regularly. Gave suggestions for other whole grains that she can try adding into diet such as whole grain cereals, brown rice, whole wheat  pasta and oatmeal. Patient has set a diet goal related to whole grains. Patient has been walking three days a week for 20 minutes. Encouraged patient to add in an extra day or two to help her reach the weekly recommendation for physical activity.   Goal: Patient would like to work on adding more whole grains into regular diet. Patient will try adding 1 serving of whole grains into her daily diet. Patient will work on reaching this goal over the next month.   Navigation:  I will notify patient of lab results.  Patient is aware of 2 more health coaching sessions and a follow up.  Time: 25 minutes

## 2023-04-19 LAB — LIPID PANEL
Chol/HDL Ratio: 3.2 ratio (ref 0.0–4.4)
Cholesterol, Total: 163 mg/dL (ref 100–199)
HDL: 51 mg/dL (ref >39)
LDL Chol Calc (NIH): 97 mg/dL (ref 0–99)
Triglycerides: 80 mg/dL (ref 0–149)
VLDL Cholesterol Cal: 15 mg/dL (ref 5–40)

## 2023-04-19 LAB — HEMOGLOBIN A1C
Est. average glucose Bld gHb Est-mCnc: 117 mg/dL
Hgb A1c MFr Bld: 5.7 % — ABNORMAL HIGH (ref 4.8–5.6)

## 2023-04-19 LAB — GLUCOSE, RANDOM: Glucose: 92 mg/dL (ref 70–99)

## 2023-04-27 ENCOUNTER — Telehealth: Payer: Self-pay

## 2023-04-27 NOTE — Telephone Encounter (Signed)
Health coaching 2   interpreter- Natale Lay, [employer]   Labs- 163 cholesterol, 97 LDL cholesterol, 80 triglycerides, 51 HDL cholesterol, 5.7 hemoglobin A1C, 92 mean plasma glucose. Patient understands and is aware of her lab results.   Goals-  1. Reduce the amount of sweet and sugary foods and drinks consumed.  2. Cut back on the amount of carbs consumed. 3. Daily exercise for 20-30 minutes.   Navigation:  Patient is aware of 1 more health coaching sessions and a follow up. Mailed patient [employer] brochure to help with food groups and portion sizes.   Time- 10 minutes

## 2023-05-05 ENCOUNTER — Ambulatory Visit: Payer: Self-pay | Admitting: *Deleted

## 2023-05-05 ENCOUNTER — Ambulatory Visit
Admission: RE | Admit: 2023-05-05 | Discharge: 2023-05-05 | Disposition: A | Discharge status: Home / Self Care | Payer: No Typology Code available for payment source | Source: Ambulatory Visit | Attending: Obstetrics and Gynecology | Admitting: Obstetrics and Gynecology

## 2023-05-05 VITALS — BP 98/70 | Wt 168.8 lb

## 2023-05-05 DIAGNOSIS — Z1231 Encounter for screening mammogram for malignant neoplasm of breast: Secondary | ICD-10-CM

## 2023-05-05 DIAGNOSIS — Z01419 Encounter for gynecological examination (general) (routine) without abnormal findings: Secondary | ICD-10-CM

## 2023-05-05 NOTE — Progress Notes (Signed)
Hayley Wolfe is a 42 y.o. [phone] female who presents to [facility] clinic today with no complaints.     Pap Smear: Pap smear completed today. Last Pap smear was 01/06/2018 at [facility] clinic and was normal with negative HPV. Per patient has no history of an abnormal Pap smear. Last Pap smear result is available in Epic.   Physical exam: Breasts Breasts symmetrical. No skin abnormalities bilateral breasts. No nipple retraction bilateral breasts. No nipple discharge bilateral breasts. No lymphadenopathy. No lumps palpated bilateral breasts. Left breast tender with no lumps noted.   MS DIGITAL DIAG TOMO BILAT  Result Date: 04/13/2022 CLINICAL DATA:  42 year old female with diffuse, intermittent left breast pain for 9 months. EXAM: DIGITAL DIAGNOSTIC BILATERAL MAMMOGRAM WITH TOMOSYNTHESIS AND CAD TECHNIQUE: Bilateral digital diagnostic mammography and breast tomosynthesis was performed. The images were evaluated with computer-aided detection. COMPARISON:  None available. ACR Breast Density Category c: The breast tissue is heterogeneously dense, which may obscure small masses. FINDINGS: No focal or suspicious mammographic findings are identified in either breast. IMPRESSION: No mammographic evidence of malignancy in either breast. RECOMMENDATION: 1. Clinical follow-up recommended for the diffusely painful area of concern in the left breast. Any further workup should be based on clinical grounds. 2.  Screening mammogram in one year.(Code:[id number]-01Y) I have discussed the findings and recommendations with the patient. If applicable, a reminder letter will be sent to the patient regarding the next appointment. BI-RADS CATEGORY  1: Negative. Electronically Signed   By: Sande Brothers M.D.   On: 04/13/2022 13:03    Pelvic/Bimanual Ext Genitalia No lesions, no swelling and no discharge observed on external genitalia.        Vagina Vagina pink and normal texture. No lesions or discharge observed  in vagina.        Cervix Cervix is present. Cervix pink and of normal texture. No discharge observed.    Uterus Uterus is present and palpable. Uterus in normal position and normal size.        Adnexae Bilateral ovaries present and palpable. No tenderness on palpation.         Rectovaginal No rectal exam completed today since patient had no rectal complaints. No skin abnormalities observed on exam.     Smoking History: Patient has never smoked. Not referred to quit line.    Patient Navigation: Patient education provided. Access to services provided for patient through [facility] program. Natale Lay interpreter provided. No transportation provided   Colorectal Cancer Screening: Per patient has never had colonoscopy completed. Not indicated at present age. No complaints today.    Breast and Cervical Cancer Risk Assessment: Patient does not have family history of breast cancer, known genetic mutations, or radiation treatment to the chest before age 21. Patient does not have history of cervical dysplasia, immunocompromised, or DES exposure in-utero.  Risk Scores as of Encounter on 05/05/2023     Hayley Wolfe           5-year 0.68%   Lifetime 11%   This patient is [place] but has no documented birth country, so the [place] model used data from [place] patients to calculate their risk score. Document a birth country in the Demographics activity for a more accurate score.         Last calculated by Meryl Dare, CMA on 05/05/2023 at 10:44 AM        A: [facility] exam with pap smear No complaints or concerns with breast.   P: Referred patient to the  [facility] for a screening mammogram. Appointment scheduled 05/05/2023 at 1130 am at [facility].  Joette Catching, RN 05/05/2023 11:15 AM   Attestation of Supervision of Student:  I confirm that I have verified the information documented in the nurse practitioner student's note and that I have also personally  reperformed the history, physical exam and all medical decision making activities.  I have verified that all services and findings are accurately documented in this student's note; and I agree with management and plan as outlined in the documentation. I have also made any necessary editorial changes.  Brannock, Kathaleen Maser, RN [facility] [employer], [facility] 05/05/2023 12:17 PM

## 2023-05-05 NOTE — Patient Instructions (Signed)
Explained breast self awareness with [employer]. Pap smear completed today. Let her know [facility] will cover Pap smears and HPV typing every 5 years unless has a history of abnormal Pap smears. Referred patient to the [facility] for a screening mammogram. Appointment scheduled 05/05/2023 at 1130 am at [facility]. Patient aware of appointment and will be there. Let patient know the Breast Center will follow up with her within the next couple weeks with results of her mammogram by letter or phone. [employer] verbalized understanding.  Lancer Thurner, Kathaleen Maser, RN 10:56 AM

## 2023-05-06 LAB — CYTOLOGY - PAP
Comment: NEGATIVE
Diagnosis: NEGATIVE
High risk HPV: NEGATIVE

## 2023-05-09 ENCOUNTER — Other Ambulatory Visit: Payer: Self-pay | Admitting: Obstetrics and Gynecology

## 2023-05-09 ENCOUNTER — Encounter: Payer: Self-pay | Admitting: Student

## 2023-05-09 DIAGNOSIS — R928 Other abnormal and inconclusive findings on diagnostic imaging of breast: Secondary | ICD-10-CM

## 2023-05-09 NOTE — Progress Notes (Signed)
Normal pap letter mailed to patient. Repeat pap in 5 years.

## 2023-05-31 ENCOUNTER — Ambulatory Visit: Admission: RE | Admit: 2023-05-31 | Payer: No Typology Code available for payment source | Source: Ambulatory Visit

## 2023-05-31 ENCOUNTER — Ambulatory Visit
Admission: RE | Admit: 2023-05-31 | Discharge: 2023-05-31 | Disposition: A | Discharge status: Home / Self Care | Payer: No Typology Code available for payment source | Source: Ambulatory Visit | Attending: Obstetrics and Gynecology | Admitting: Obstetrics and Gynecology

## 2023-05-31 DIAGNOSIS — R928 Other abnormal and inconclusive findings on diagnostic imaging of breast: Secondary | ICD-10-CM

## 2024-01-31 ENCOUNTER — Telehealth: Payer: Self-pay

## 2024-01-31 NOTE — Telephone Encounter (Signed)
  Health Coaching 3   interpreter- Natale Lay, [employer]   Goals- Patient declined referral to PCP after initial screening. Patient states that she has been trying to eat healthier. Encouraged patient to continue to try and add exercise into her daily routine. Explained to patient that the goal would be for 20-30 minutes daily.      Navigation:  Patient is aware of  a follow up session. FU scheduled for 03/07/24

## 2024-03-07 ENCOUNTER — Inpatient Hospital Stay

## 2024-03-29 IMAGING — MG DIGITAL DIAGNOSTIC BILAT W/ TOMO W/ CAD
8 series · 9 of 24 positions shown · non-contrast
Comparison: None available.

CLINICAL DATA: 40-year-old female with diffuse, intermittent left
breast pain for 9 months.

EXAM:
DIGITAL DIAGNOSTIC BILATERAL MAMMOGRAM WITH TOMOSYNTHESIS AND CAD
TECHNIQUE: Bilateral digital diagnostic mammography and breast tomosynthesis
was performed. The images were evaluated with computer-aided
detection.

[R MLO synth-2D]
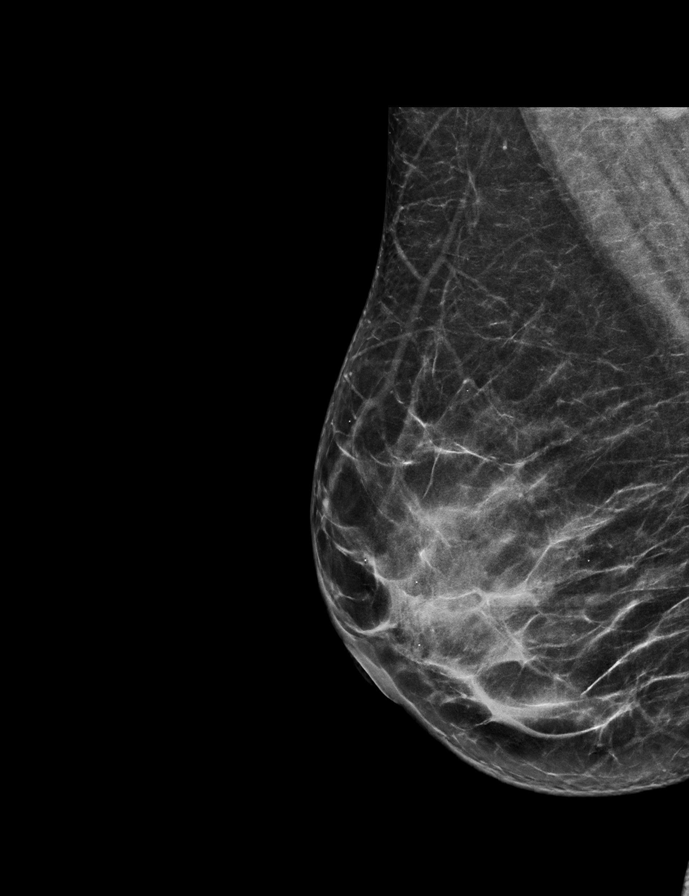

[L CC synth-2D]
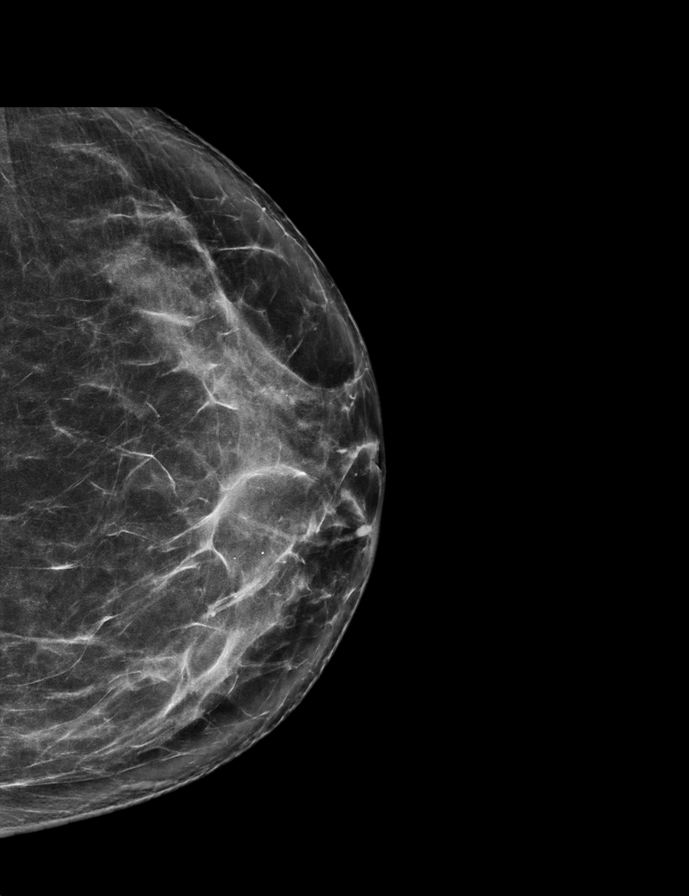

[R CC synth-2D]
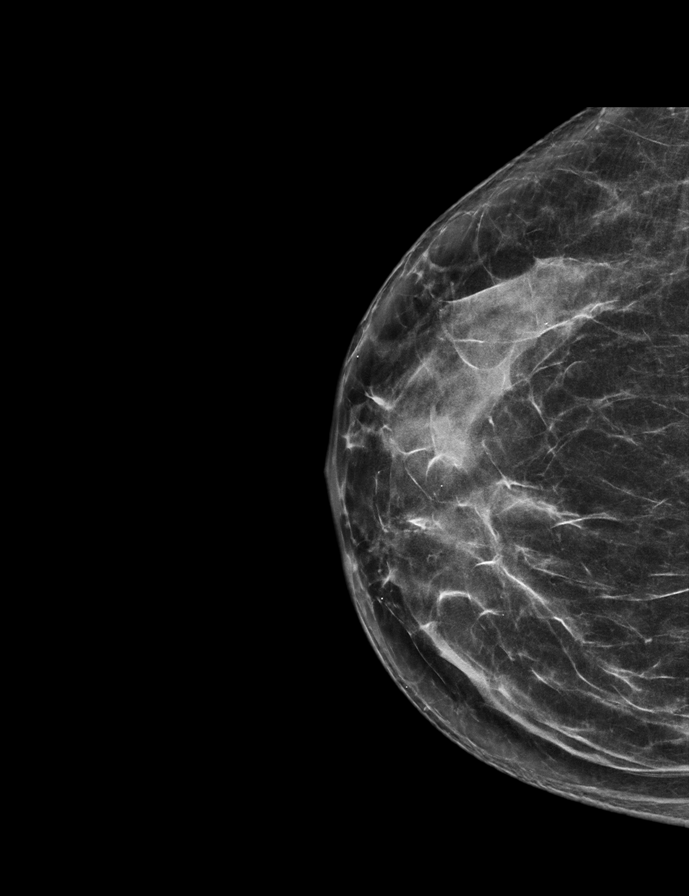

[L MLO synth-2D]
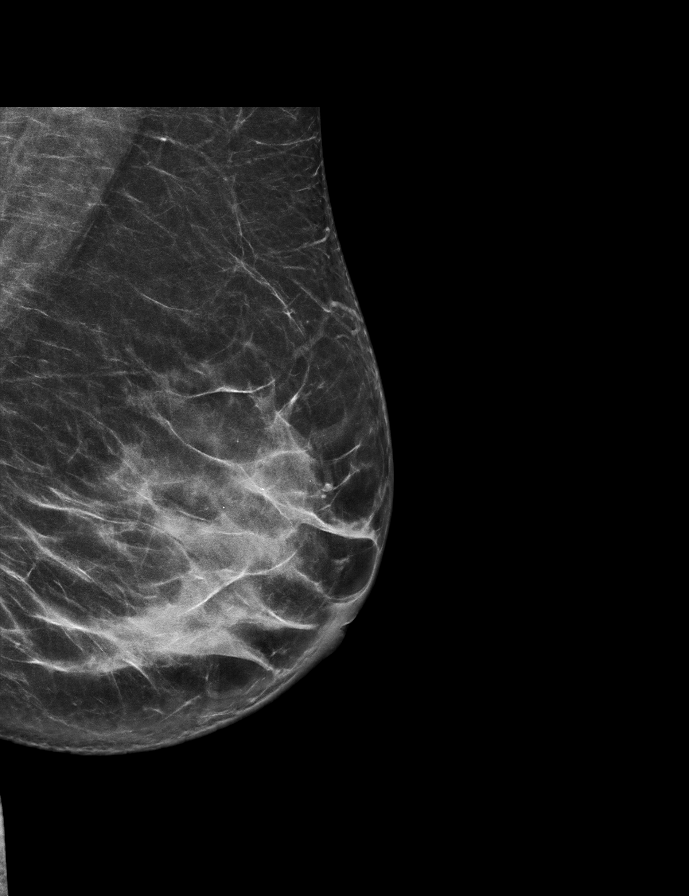

[L MLO tomo · 2 of 75 frames shown]
[frame 25/75]
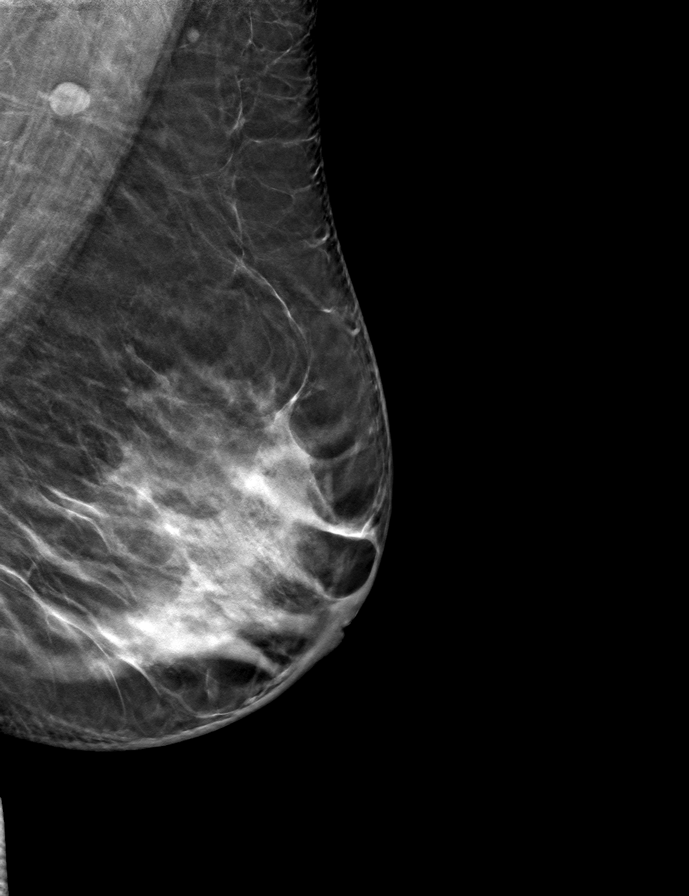
[frame 38/75]
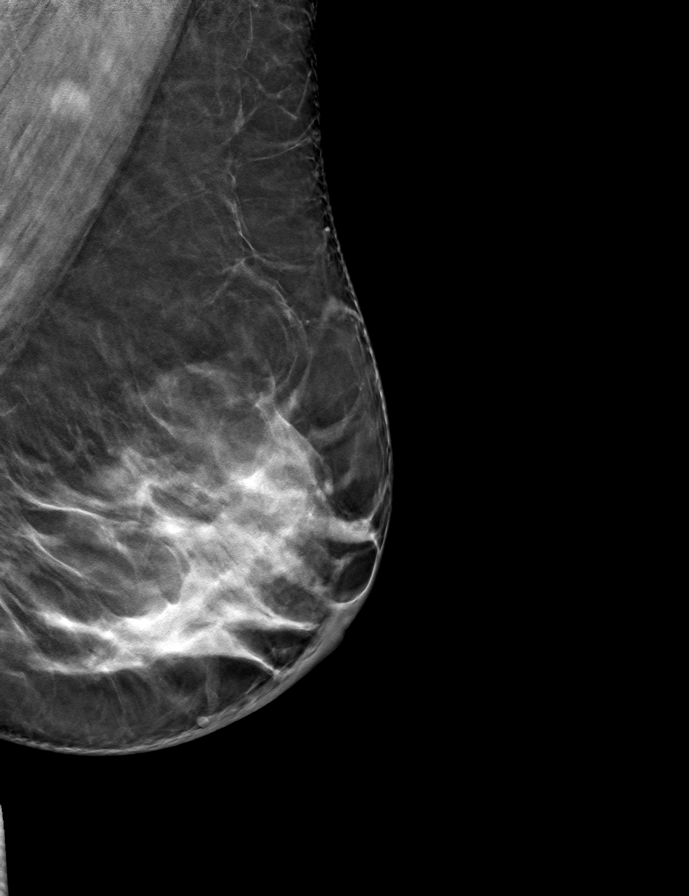

[R CC tomo · tomo slice 33/65.0]
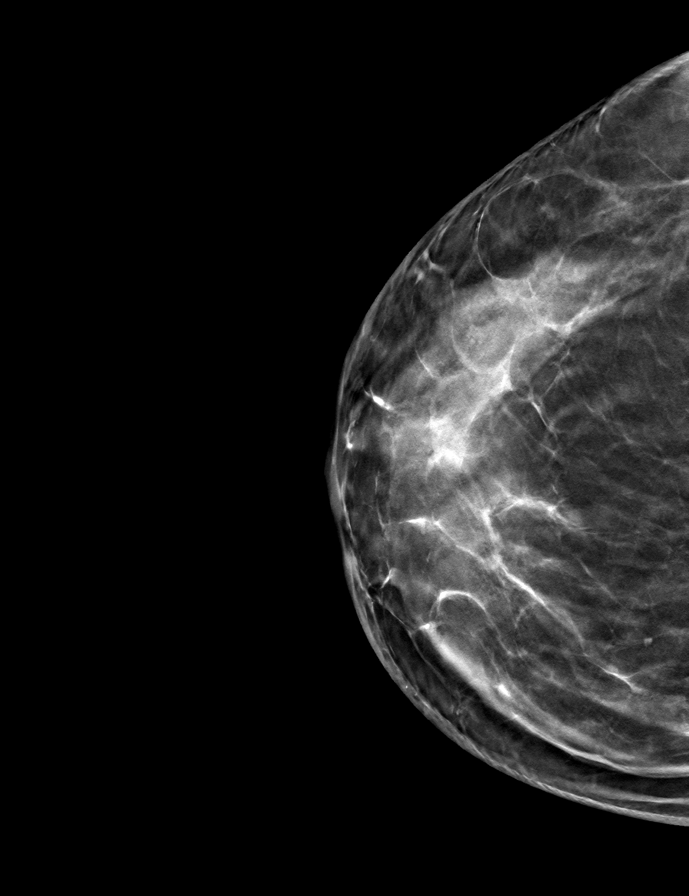

[L CC tomo · tomo slice 37/73.0]
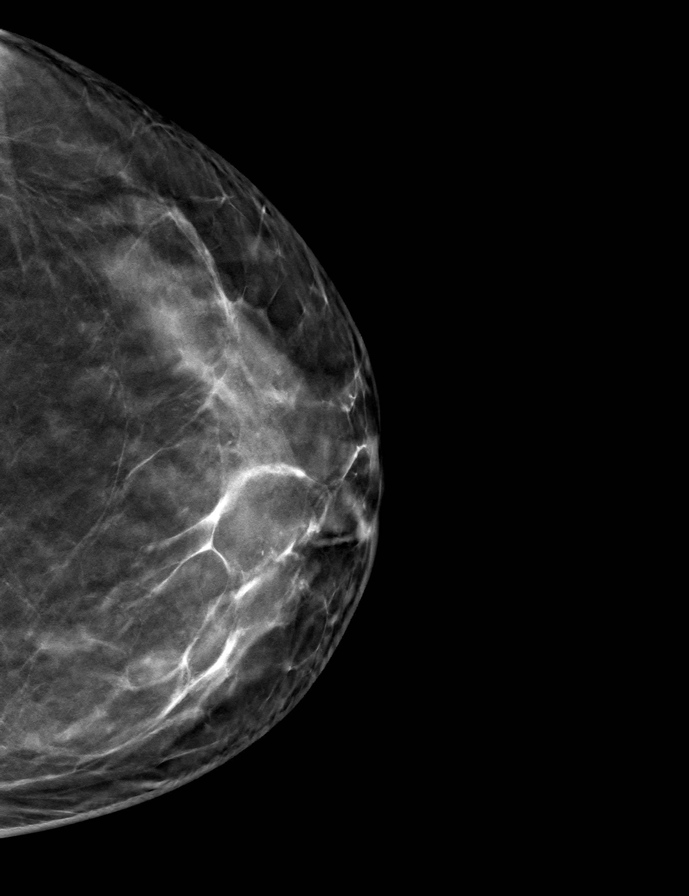

[R MLO tomo · tomo slice 34/67.0]
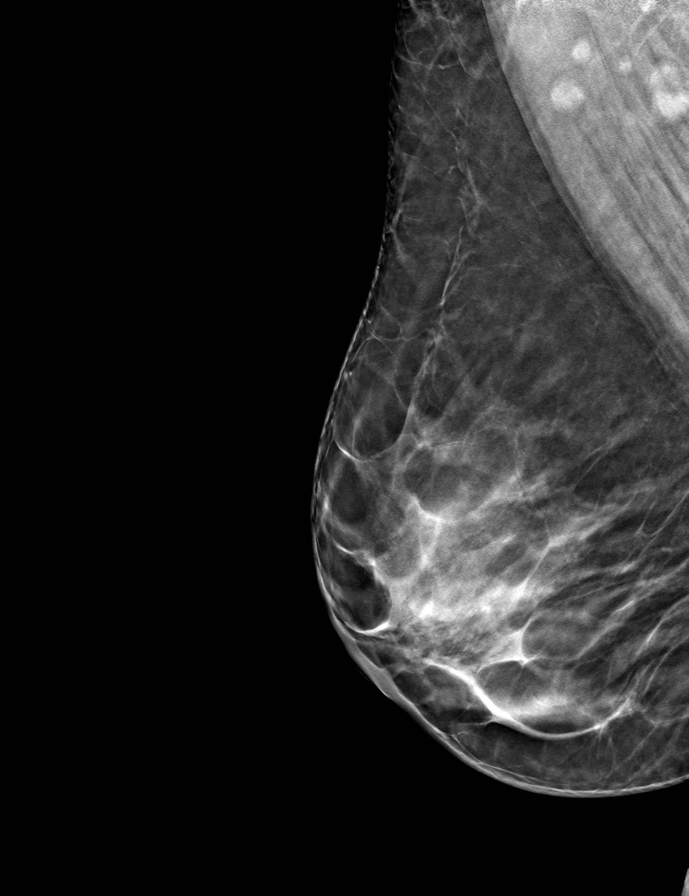

[9 of 24 positions shown; findings below may reference images not displayed]

ACR Breast Density Category c: The breast tissue is heterogeneously
dense, which may obscure small masses.
FINDINGS: No focal or suspicious mammographic findings are identified in
either breast.
IMPRESSION: No mammographic evidence of malignancy in either breast.

RECOMMENDATION:
1. Clinical follow-up recommended for the diffusely painful area of
concern in the left breast. Any further workup should be based on
clinical grounds.
2.  Screening mammogram in one year.(Code:GX-[id number])

I have discussed the findings and recommendations with the patient.
If applicable, a reminder letter will be sent to the patient
regarding the next appointment.

BI-RADS CATEGORY  1: Negative.
